# Patient Record
Sex: Female | Born: 1935 | Race: White | Hispanic: No | Smoking: Never smoker
Health system: Southern US, Community
[De-identification: ages and names within clinical notes are randomized; demographics above are authoritative.]

## PROBLEM LIST (undated history)

## (undated) DIAGNOSIS — N189 Chronic kidney disease, unspecified: Secondary | ICD-10-CM

## (undated) DIAGNOSIS — I739 Peripheral vascular disease, unspecified: Secondary | ICD-10-CM

## (undated) DIAGNOSIS — G459 Transient cerebral ischemic attack, unspecified: Secondary | ICD-10-CM

## (undated) DIAGNOSIS — E785 Hyperlipidemia, unspecified: Secondary | ICD-10-CM

## (undated) DIAGNOSIS — M40209 Unspecified kyphosis, site unspecified: Secondary | ICD-10-CM

## (undated) DIAGNOSIS — H3530 Unspecified macular degeneration: Secondary | ICD-10-CM

## (undated) DIAGNOSIS — S32050A Wedge compression fracture of fifth lumbar vertebra, initial encounter for closed fracture: Secondary | ICD-10-CM

## (undated) DIAGNOSIS — IMO0002 Reserved for concepts with insufficient information to code with codable children: Secondary | ICD-10-CM

## (undated) DIAGNOSIS — I4891 Unspecified atrial fibrillation: Secondary | ICD-10-CM

## (undated) DIAGNOSIS — R634 Abnormal weight loss: Secondary | ICD-10-CM

## (undated) DIAGNOSIS — R0600 Dyspnea, unspecified: Secondary | ICD-10-CM

## (undated) DIAGNOSIS — S301XXA Contusion of abdominal wall, initial encounter: Secondary | ICD-10-CM

## (undated) DIAGNOSIS — I10 Essential (primary) hypertension: Secondary | ICD-10-CM

## (undated) DIAGNOSIS — E875 Hyperkalemia: Secondary | ICD-10-CM

## (undated) DIAGNOSIS — Z7901 Long term (current) use of anticoagulants: Secondary | ICD-10-CM

## (undated) HISTORY — DX: Unspecified kyphosis, site unspecified: M40.209

## (undated) HISTORY — DX: Peripheral vascular disease, unspecified: I73.9

## (undated) HISTORY — DX: Dyspnea, unspecified: R06.00

## (undated) HISTORY — DX: Long term (current) use of anticoagulants: Z79.01

## (undated) HISTORY — DX: Hyperkalemia: E87.5

## (undated) HISTORY — DX: Chronic kidney disease, unspecified: N18.9

## (undated) HISTORY — DX: Abnormal weight loss: R63.4

## (undated) HISTORY — DX: Unspecified macular degeneration: H35.30

## (undated) HISTORY — DX: Unspecified atrial fibrillation: I48.91

## (undated) HISTORY — PX: APPENDECTOMY: SHX54

## (undated) HISTORY — DX: Hyperlipidemia, unspecified: E78.5

## (undated) HISTORY — DX: Essential (primary) hypertension: I10

---

## 1973-05-27 HISTORY — PX: CHOLECYSTECTOMY: SHX55

## 2002-06-18 ENCOUNTER — Encounter: Payer: Self-pay | Admitting: Family Medicine

## 2002-06-18 ENCOUNTER — Ambulatory Visit (HOSPITAL_COMMUNITY): Admission: RE | Admit: 2002-06-18 | Discharge: 2002-06-18 | Payer: Self-pay | Admitting: Family Medicine

## 2002-06-25 ENCOUNTER — Ambulatory Visit (HOSPITAL_COMMUNITY): Admission: RE | Admit: 2002-06-25 | Discharge: 2002-06-25 | Payer: Self-pay | Admitting: Family Medicine

## 2002-06-25 ENCOUNTER — Encounter: Payer: Self-pay | Admitting: Family Medicine

## 2002-07-22 ENCOUNTER — Encounter (INDEPENDENT_AMBULATORY_CARE_PROVIDER_SITE_OTHER): Payer: Self-pay | Admitting: *Deleted

## 2002-07-22 ENCOUNTER — Ambulatory Visit (HOSPITAL_COMMUNITY): Admission: RE | Admit: 2002-07-22 | Discharge: 2002-07-22 | Payer: Self-pay | Admitting: Neurosurgery

## 2002-07-22 ENCOUNTER — Encounter (INDEPENDENT_AMBULATORY_CARE_PROVIDER_SITE_OTHER): Payer: Self-pay | Admitting: Specialist

## 2002-07-22 ENCOUNTER — Encounter: Payer: Self-pay | Admitting: Neurosurgery

## 2002-09-21 ENCOUNTER — Encounter: Payer: Self-pay | Admitting: Neurosurgery

## 2002-09-21 ENCOUNTER — Encounter: Admission: RE | Admit: 2002-09-21 | Discharge: 2002-09-21 | Payer: Self-pay | Admitting: Neurosurgery

## 2002-10-26 ENCOUNTER — Encounter: Admission: RE | Admit: 2002-10-26 | Discharge: 2002-10-26 | Payer: Self-pay | Admitting: Neurosurgery

## 2002-10-26 ENCOUNTER — Encounter: Payer: Self-pay | Admitting: Neurosurgery

## 2002-12-14 ENCOUNTER — Encounter: Admission: RE | Admit: 2002-12-14 | Discharge: 2002-12-14 | Payer: Self-pay | Admitting: Neurosurgery

## 2002-12-14 ENCOUNTER — Encounter: Payer: Self-pay | Admitting: Neurosurgery

## 2003-12-06 ENCOUNTER — Ambulatory Visit (HOSPITAL_COMMUNITY): Admission: RE | Admit: 2003-12-06 | Discharge: 2003-12-06 | Payer: Self-pay | Admitting: Family Medicine

## 2005-11-17 ENCOUNTER — Inpatient Hospital Stay (HOSPITAL_COMMUNITY): Admission: EM | Admit: 2005-11-17 | Discharge: 2005-11-22 | Payer: Self-pay | Admitting: Emergency Medicine

## 2005-11-17 ENCOUNTER — Ambulatory Visit: Payer: Self-pay | Admitting: Cardiovascular Disease

## 2005-11-18 ENCOUNTER — Encounter: Payer: Self-pay | Admitting: Cardiology

## 2005-11-18 ENCOUNTER — Encounter (INDEPENDENT_AMBULATORY_CARE_PROVIDER_SITE_OTHER): Payer: Self-pay | Admitting: *Deleted

## 2005-11-25 ENCOUNTER — Ambulatory Visit: Payer: Self-pay | Admitting: *Deleted

## 2005-11-29 ENCOUNTER — Ambulatory Visit: Payer: Self-pay | Admitting: *Deleted

## 2005-12-06 ENCOUNTER — Ambulatory Visit: Payer: Self-pay | Admitting: Cardiology

## 2005-12-10 ENCOUNTER — Ambulatory Visit: Payer: Self-pay | Admitting: Cardiology

## 2006-01-17 ENCOUNTER — Ambulatory Visit: Payer: Self-pay | Admitting: Cardiology

## 2006-01-31 ENCOUNTER — Ambulatory Visit: Payer: Self-pay | Admitting: Cardiology

## 2006-02-07 ENCOUNTER — Ambulatory Visit: Payer: Self-pay | Admitting: Cardiology

## 2006-03-11 ENCOUNTER — Ambulatory Visit: Payer: Self-pay | Admitting: Cardiology

## 2006-04-16 ENCOUNTER — Ambulatory Visit: Payer: Self-pay | Admitting: Cardiology

## 2006-05-13 ENCOUNTER — Ambulatory Visit: Payer: Self-pay | Admitting: Cardiology

## 2006-06-03 ENCOUNTER — Ambulatory Visit: Payer: Self-pay | Admitting: Cardiology

## 2006-07-03 ENCOUNTER — Ambulatory Visit: Payer: Self-pay | Admitting: Internal Medicine

## 2006-08-01 ENCOUNTER — Ambulatory Visit: Payer: Self-pay | Admitting: Cardiology

## 2006-08-29 ENCOUNTER — Ambulatory Visit: Payer: Self-pay | Admitting: Cardiology

## 2006-09-26 ENCOUNTER — Ambulatory Visit (HOSPITAL_COMMUNITY): Admission: RE | Admit: 2006-09-26 | Discharge: 2006-09-26 | Payer: Self-pay | Admitting: Cardiology

## 2006-09-26 ENCOUNTER — Ambulatory Visit: Payer: Self-pay | Admitting: Cardiology

## 2006-10-02 ENCOUNTER — Ambulatory Visit: Payer: Self-pay | Admitting: Cardiology

## 2006-10-02 ENCOUNTER — Ambulatory Visit (HOSPITAL_COMMUNITY): Admission: RE | Admit: 2006-10-02 | Discharge: 2006-10-02 | Payer: Self-pay | Admitting: Cardiology

## 2006-10-03 ENCOUNTER — Ambulatory Visit: Payer: Self-pay | Admitting: Cardiology

## 2006-10-21 ENCOUNTER — Ambulatory Visit: Payer: Self-pay | Admitting: Cardiology

## 2006-11-07 ENCOUNTER — Ambulatory Visit: Payer: Self-pay | Admitting: Cardiology

## 2006-12-10 ENCOUNTER — Ambulatory Visit: Payer: Self-pay | Admitting: Cardiology

## 2006-12-17 ENCOUNTER — Ambulatory Visit: Payer: Self-pay | Admitting: Cardiovascular Disease

## 2007-01-15 ENCOUNTER — Ambulatory Visit: Payer: Self-pay | Admitting: Cardiology

## 2007-01-22 ENCOUNTER — Ambulatory Visit: Payer: Self-pay | Admitting: Cardiology

## 2007-01-30 ENCOUNTER — Ambulatory Visit (HOSPITAL_COMMUNITY): Admission: RE | Admit: 2007-01-30 | Discharge: 2007-01-30 | Payer: Self-pay | Admitting: Family Medicine

## 2007-02-20 ENCOUNTER — Ambulatory Visit: Payer: Self-pay | Admitting: Cardiology

## 2007-03-06 ENCOUNTER — Ambulatory Visit: Payer: Self-pay | Admitting: Internal Medicine

## 2007-03-13 ENCOUNTER — Ambulatory Visit: Payer: Self-pay | Admitting: Cardiology

## 2007-03-25 ENCOUNTER — Ambulatory Visit: Payer: Self-pay | Admitting: Cardiology

## 2007-04-15 ENCOUNTER — Ambulatory Visit: Payer: Self-pay | Admitting: Cardiology

## 2007-05-18 ENCOUNTER — Ambulatory Visit: Payer: Self-pay | Admitting: Internal Medicine

## 2007-06-19 ENCOUNTER — Ambulatory Visit: Payer: Self-pay | Admitting: Cardiology

## 2007-07-17 ENCOUNTER — Ambulatory Visit: Payer: Self-pay | Admitting: Cardiology

## 2007-08-12 ENCOUNTER — Encounter (INDEPENDENT_AMBULATORY_CARE_PROVIDER_SITE_OTHER): Payer: Self-pay | Admitting: *Deleted

## 2007-08-12 LAB — CONVERTED CEMR LAB
Albumin: 4.1 g/dL
Cholesterol: 179 mg/dL
Triglycerides: 134 mg/dL

## 2007-08-14 ENCOUNTER — Ambulatory Visit: Payer: Self-pay | Admitting: Cardiology

## 2007-09-15 ENCOUNTER — Ambulatory Visit: Payer: Self-pay | Admitting: Cardiology

## 2007-09-15 ENCOUNTER — Ambulatory Visit (HOSPITAL_COMMUNITY): Admission: RE | Admit: 2007-09-15 | Discharge: 2007-09-15 | Payer: Self-pay | Admitting: Cardiology

## 2007-09-24 ENCOUNTER — Ambulatory Visit (HOSPITAL_COMMUNITY): Admission: RE | Admit: 2007-09-24 | Discharge: 2007-09-24 | Payer: Self-pay | Admitting: Cardiology

## 2007-10-13 ENCOUNTER — Ambulatory Visit: Payer: Self-pay | Admitting: Cardiology

## 2007-11-09 ENCOUNTER — Ambulatory Visit: Payer: Self-pay | Admitting: Cardiology

## 2007-12-18 ENCOUNTER — Ambulatory Visit: Payer: Self-pay | Admitting: Cardiology

## 2008-01-25 ENCOUNTER — Ambulatory Visit: Payer: Self-pay | Admitting: Cardiology

## 2008-02-08 ENCOUNTER — Ambulatory Visit: Payer: Self-pay | Admitting: Cardiology

## 2008-02-29 ENCOUNTER — Ambulatory Visit: Payer: Self-pay | Admitting: Cardiology

## 2008-03-14 ENCOUNTER — Ambulatory Visit: Payer: Self-pay | Admitting: Cardiology

## 2008-03-31 ENCOUNTER — Ambulatory Visit: Payer: Self-pay | Admitting: Cardiology

## 2008-04-28 ENCOUNTER — Ambulatory Visit: Payer: Self-pay | Admitting: Cardiology

## 2008-05-27 ENCOUNTER — Encounter (INDEPENDENT_AMBULATORY_CARE_PROVIDER_SITE_OTHER): Payer: Self-pay | Admitting: *Deleted

## 2008-05-27 LAB — CONVERTED CEMR LAB
ALT: 18 units/L
Alkaline Phosphatase: 57 units/L
BUN: 24 mg/dL
CO2: 23 meq/L
Chloride: 106 meq/L
Glucose, Bld: 94 mg/dL
HDL: 57 mg/dL
Potassium: 4.5 meq/L
Sodium: 142 meq/L
TSH: 1.812 microintl units/mL
Total Protein: 7.4 g/dL
Triglycerides: 196 mg/dL

## 2008-05-30 ENCOUNTER — Ambulatory Visit: Payer: Self-pay | Admitting: Cardiology

## 2008-06-20 ENCOUNTER — Ambulatory Visit: Payer: Self-pay | Admitting: Cardiology

## 2008-07-18 ENCOUNTER — Ambulatory Visit: Payer: Self-pay | Admitting: Cardiology

## 2008-08-08 ENCOUNTER — Ambulatory Visit: Payer: Self-pay | Admitting: Cardiology

## 2008-09-08 ENCOUNTER — Ambulatory Visit: Payer: Self-pay | Admitting: Cardiology

## 2008-10-13 ENCOUNTER — Ambulatory Visit: Payer: Self-pay | Admitting: Cardiology

## 2008-10-31 ENCOUNTER — Encounter (INDEPENDENT_AMBULATORY_CARE_PROVIDER_SITE_OTHER): Payer: Self-pay | Admitting: *Deleted

## 2008-11-17 ENCOUNTER — Ambulatory Visit: Payer: Self-pay | Admitting: Cardiology

## 2008-12-15 ENCOUNTER — Ambulatory Visit: Payer: Self-pay | Admitting: Cardiology

## 2009-01-09 ENCOUNTER — Encounter: Payer: Self-pay | Admitting: *Deleted

## 2009-01-09 ENCOUNTER — Ambulatory Visit: Payer: Self-pay | Admitting: Cardiology

## 2009-02-08 ENCOUNTER — Ambulatory Visit: Payer: Self-pay

## 2009-02-08 LAB — CONVERTED CEMR LAB: POC INR: 2

## 2009-02-20 ENCOUNTER — Encounter: Payer: Self-pay | Admitting: Cardiology

## 2009-03-13 ENCOUNTER — Ambulatory Visit: Payer: Self-pay | Admitting: Cardiology

## 2009-03-13 LAB — CONVERTED CEMR LAB: POC INR: 2.1

## 2009-04-24 ENCOUNTER — Ambulatory Visit: Payer: Self-pay | Admitting: Cardiology

## 2009-06-01 DIAGNOSIS — E875 Hyperkalemia: Secondary | ICD-10-CM

## 2009-06-01 DIAGNOSIS — M810 Age-related osteoporosis without current pathological fracture: Secondary | ICD-10-CM | POA: Insufficient documentation

## 2009-06-01 DIAGNOSIS — H3530 Unspecified macular degeneration: Secondary | ICD-10-CM | POA: Insufficient documentation

## 2009-06-02 ENCOUNTER — Ambulatory Visit: Payer: Self-pay | Admitting: Cardiology

## 2009-06-02 ENCOUNTER — Encounter (INDEPENDENT_AMBULATORY_CARE_PROVIDER_SITE_OTHER): Payer: Self-pay | Admitting: *Deleted

## 2009-06-02 LAB — CONVERTED CEMR LAB: POC INR: 2.5

## 2009-06-12 ENCOUNTER — Encounter: Payer: Self-pay | Admitting: Cardiology

## 2009-06-12 ENCOUNTER — Encounter (INDEPENDENT_AMBULATORY_CARE_PROVIDER_SITE_OTHER): Payer: Self-pay | Admitting: *Deleted

## 2009-06-12 LAB — CONVERTED CEMR LAB
ALT: 21 units/L (ref 0–35)
BUN: 31 mg/dL
Basophils Absolute: 0.1 10*3/uL (ref 0.0–0.1)
CO2: 24 meq/L
CO2: 24 meq/L (ref 19–32)
Chloride: 105 meq/L
Creatinine, Ser: 1.38 mg/dL
Eosinophils Absolute: 0.2 10*3/uL (ref 0.0–0.7)
HCT: 43 %
HCT: 43 % (ref 36.0–46.0)
HDL: 58 mg/dL (ref >39)
Hemoglobin: 14 g/dL
Hemoglobin: 14 g/dL (ref 12.0–15.0)
Lymphs Abs: 2.1 10*3/uL (ref 0.7–4.0)
MCHC: 32.6 g/dL (ref 30.0–36.0)
MCV: 97.5 fL
MCV: 97.5 fL (ref 78.0–100.0)
Monocytes Absolute: 0.4 10*3/uL (ref 0.1–1.0)
Neutro Abs: 4.5 10*3/uL (ref 1.7–7.7)
Neutrophils Relative %: 62 % (ref 43–77)
Platelets: 263 10*3/uL
Potassium: 5 meq/L
RBC: 4.41 M/uL (ref 3.87–5.11)
Total CHOL/HDL Ratio: 2.8
WBC: 7.2 10*3/uL (ref 4.0–10.5)

## 2009-06-14 ENCOUNTER — Encounter (INDEPENDENT_AMBULATORY_CARE_PROVIDER_SITE_OTHER): Payer: Self-pay | Admitting: *Deleted

## 2009-06-28 ENCOUNTER — Ambulatory Visit: Payer: Self-pay | Admitting: Cardiology

## 2009-06-28 LAB — CONVERTED CEMR LAB: POC INR: 2.5

## 2009-07-26 ENCOUNTER — Ambulatory Visit: Payer: Self-pay | Admitting: Cardiology

## 2009-07-26 LAB — CONVERTED CEMR LAB: POC INR: 2.6

## 2009-08-23 ENCOUNTER — Ambulatory Visit: Payer: Self-pay | Admitting: Cardiology

## 2009-09-21 ENCOUNTER — Ambulatory Visit: Payer: Self-pay | Admitting: Cardiology

## 2009-10-25 ENCOUNTER — Ambulatory Visit: Payer: Self-pay | Admitting: Cardiology

## 2009-10-25 LAB — CONVERTED CEMR LAB: POC INR: 2.3

## 2009-10-26 ENCOUNTER — Encounter: Payer: Self-pay | Admitting: Cardiology

## 2009-11-08 ENCOUNTER — Ambulatory Visit: Payer: Self-pay | Admitting: Cardiology

## 2009-11-08 DIAGNOSIS — I951 Orthostatic hypotension: Secondary | ICD-10-CM

## 2009-11-22 ENCOUNTER — Ambulatory Visit: Payer: Self-pay | Admitting: Cardiology

## 2009-11-22 LAB — CONVERTED CEMR LAB: POC INR: 2

## 2009-11-24 ENCOUNTER — Telehealth (INDEPENDENT_AMBULATORY_CARE_PROVIDER_SITE_OTHER): Payer: Self-pay | Admitting: *Deleted

## 2009-11-29 ENCOUNTER — Encounter (INDEPENDENT_AMBULATORY_CARE_PROVIDER_SITE_OTHER): Payer: Self-pay | Admitting: *Deleted

## 2009-12-20 ENCOUNTER — Ambulatory Visit: Payer: Self-pay | Admitting: Cardiology

## 2010-01-10 ENCOUNTER — Ambulatory Visit: Payer: Self-pay | Admitting: Cardiology

## 2010-01-31 ENCOUNTER — Ambulatory Visit: Payer: Self-pay | Admitting: Cardiology

## 2010-01-31 LAB — CONVERTED CEMR LAB: POC INR: 1.9

## 2010-02-21 ENCOUNTER — Ambulatory Visit: Payer: Self-pay | Admitting: Cardiology

## 2010-02-21 LAB — CONVERTED CEMR LAB: POC INR: 2.4

## 2010-02-27 ENCOUNTER — Telehealth (INDEPENDENT_AMBULATORY_CARE_PROVIDER_SITE_OTHER): Payer: Self-pay | Admitting: *Deleted

## 2010-03-16 LAB — CONVERTED CEMR LAB
ALT: 22 units/L
AST: 41 units/L
BUN: 30 mg/dL
GFR calc non Af Amer: 34 mL/min
Glomerular Filtration Rate, Af Am: 42 mL/min/{1.73_m2}
HDL: 61 mg/dL
Hgb A1c MFr Bld: 6.2 %
Potassium: 4.7 meq/L
Sodium: 140 meq/L
Triglycerides: 77 mg/dL

## 2010-03-21 ENCOUNTER — Ambulatory Visit: Payer: Self-pay | Admitting: Cardiology

## 2010-03-21 LAB — CONVERTED CEMR LAB: POC INR: 1.8

## 2010-04-11 ENCOUNTER — Ambulatory Visit: Payer: Self-pay | Admitting: Cardiology

## 2010-04-11 LAB — CONVERTED CEMR LAB: POC INR: 1.7

## 2010-04-18 ENCOUNTER — Telehealth (INDEPENDENT_AMBULATORY_CARE_PROVIDER_SITE_OTHER): Payer: Self-pay

## 2010-04-25 ENCOUNTER — Encounter (INDEPENDENT_AMBULATORY_CARE_PROVIDER_SITE_OTHER): Payer: Self-pay | Admitting: *Deleted

## 2010-05-07 ENCOUNTER — Ambulatory Visit: Payer: Self-pay | Admitting: Cardiology

## 2010-06-07 ENCOUNTER — Ambulatory Visit: Admission: RE | Admit: 2010-06-07 | Discharge: 2010-06-07 | Payer: Self-pay | Source: Home / Self Care

## 2010-06-07 ENCOUNTER — Ambulatory Visit (HOSPITAL_COMMUNITY)
Admission: RE | Admit: 2010-06-07 | Discharge: 2010-06-07 | Payer: Self-pay | Source: Home / Self Care | Attending: Cardiology | Admitting: Cardiology

## 2010-06-07 ENCOUNTER — Ambulatory Visit
Admission: RE | Admit: 2010-06-07 | Discharge: 2010-06-07 | Payer: Self-pay | Source: Home / Self Care | Attending: Cardiology | Admitting: Cardiology

## 2010-06-07 DIAGNOSIS — I739 Peripheral vascular disease, unspecified: Secondary | ICD-10-CM | POA: Insufficient documentation

## 2010-06-07 DIAGNOSIS — R634 Abnormal weight loss: Secondary | ICD-10-CM | POA: Insufficient documentation

## 2010-06-07 LAB — CONVERTED CEMR LAB: POC INR: 1.6

## 2010-06-20 ENCOUNTER — Ambulatory Visit: Admission: RE | Admit: 2010-06-20 | Discharge: 2010-06-20 | Payer: Self-pay | Source: Home / Self Care

## 2010-06-20 LAB — CONVERTED CEMR LAB: POC INR: 2.2

## 2010-06-26 NOTE — Medication Information (Signed)
Summary: ccr-lr  Anticoagulant Therapy  Managed by: Vashti Hey, RN PCP: Dr. Lysle Rubens MD: Dietrich Pates MD, Molly Maduro Indication 1: Atrial Fibrillation Indication 2: Cerebrovascular Accident Lab Used: [facility] [place] Site: [place] INR POC 2.0  Dietary changes: no    Health status changes: no    Bleeding/hemorrhagic complications: no    Recent/future hospitalizations: no    Any changes in medication regimen? no    Recent/future dental: no  Any missed doses?: no       Is patient compliant with meds? yes       Allergies: No Known Drug Allergies  Anticoagulation Management History:      The patient is taking warfarin and comes in today for a routine follow up visit.  Positive risk factors for bleeding include an age of 75 years or older.  The bleeding index is 'intermediate risk'.  Positive CHADS2 values include History of HTN and Age > 70 years old.  The start date was 11/22/2005.  Anticoagulation responsible provider: Dietrich Pates MD, Molly Maduro.  INR POC: 2.0.  Cuvette Lot#: [id number].  Exp: 10/11.    Anticoagulation Management Assessment/Plan:      The patient's current anticoagulation dose is Warfarin sodium 2 mg tabs: as directed by coumadin clinic.  The target INR is 2.0-3.0.  The next INR is due 12/20/2009.  Anticoagulation instructions were given to patient.  Results were reviewed/authorized by Vashti Hey, RN.  She was notified by Vashti Hey RN.         Prior Anticoagulation Instructions: INR 2.3 Continue coumadin 2mg  once daily   Current Anticoagulation Instructions: INR 2.0 Take coumadin 1 1/2 tablets tonight then resume 1 tablet once daily

## 2010-06-26 NOTE — Assessment & Plan Note (Signed)
Summary: ROV  Medications Added AMLODIPINE BESYLATE 5 MG TABS (AMLODIPINE BESYLATE) Take one tablet by mouth daily LISINOPRIL 20 MG TABS (LISINOPRIL) Take one tablet by mouth daily DONEPEZIL HCL 10 MG TABS (DONEPEZIL HCL) take 1 tab daily CITALOPRAM HYDROBROMIDE 20 MG TABS (CITALOPRAM HYDROBROMIDE) take 1 tab daily ALEVE 220 MG TABS (NAPROXEN SODIUM) take as needed FOSAMAX 70 MG TABS (ALENDRONATE SODIUM) take 1 tab weekly CLARITIN 10 MG TABS (LORATADINE) take 1 tab daily LUMIGAN 0.03 % SOLN (BIMATOPROST) 1 drop each eye bedtime ASPIR-LOW 81 MG TBEC (ASPIRIN) take 1 tab daily      Allergies Added: NKDA  Visit Type:  Follow-up Primary Provider:  Dr. Phillips Odor  CC:  no cardiology complaints.  History of Present Illness: Mrs. 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 During routine coumadin clinic check, she complained of chest discomfort.  She believed this to be related to pulled muscle from working in the yard that day. She had some positional dizziness. She was seen by the cardiology nurses.  Orthostatics were completed and found to be positive with a drop of 115/57 with pulse of 93 to 93/56 with a pulse of 137 from sitting to standing.  Dr. Dietrich Pates reviewed this and decreased her dose of Norvasc from 10mg  daily, to 5mg  daily.  Lisinopril was decreased from 40mg  to 20mg  daily.  She has not further complaints of dizziness or chest discomfort.  Her daughter states that she is much better and the patient agrees.  She is contiuing to be active and has occasional muscle aches and pains.  No chest discomfort.  Current Medications (verified): 1)  Metoprolol Succinate 25 Mg Xr24h-Tab (Metoprolol Succinate) .... Take 1 Tablet Daily 2)  Amlodipine Besylate 10 Mg Tabs (Amlodipine Besylate) .... Take One Tablet By Mouth Daily 3)  Lotensin 40 Mg Tabs (Benazepril Hcl) .... Take 1 Tablet By  Mouth Once A Day 4)  Requip 1 Mg Tabs (Ropinirole Hcl) .Marland Kitchen.. 1 Tab By Mouth At Bedtime 5)  Simvastatin 40 Mg Tabs (Simvastatin) .Marland Kitchen.. 1 Tab By Mouth At Bedtime 6)  Diphenhydramine Hcl 25 Mg Caps (Diphenhydramine Hcl) .... As Needed 7)  Warfarin Sodium 2 Mg Tabs (Warfarin Sodium) .... As Directed By Coumadin Clinic 8)  Furosemide 40 Mg Tabs (Furosemide) .Marland Kitchen.. 1 Tab By Mouth Once Daily 9)  Acetaminophen 650 Mg Cr-Tabs (Acetaminophen) .... As Needed 10)  Diazepam 2 Mg Tabs (Diazepam) .Marland Kitchen.. 1 Tab By Mouth Two Times A Day 11)  Coenzyme Q10 10 Mg Caps (Coenzyme Q10) .Marland Kitchen.. 1 Tab By Mouth Once Daily 12)  Daily Multi  Tabs (Multiple Vitamins-Minerals) .Marland Kitchen.. 1 Tab By Mouth Once Daily 13)  Fleet Laxative 5 Mg Tbec (Bisacodyl) .... As Needed 14)  Coricidin Hbp Flu 15-500-2 Mg Tabs (Dm-Apap-Cpm) .... As Needed 15)  Zyrtec Allergy 10 Mg Tabs (Cetirizine Hcl) .Marland Kitchen.. 1 Daily 16)  Donepezil Hcl 10 Mg Tabs (Donepezil Hcl) .... Take 1 Tab Daily 17)  Citalopram Hydrobromide 20 Mg Tabs (Citalopram Hydrobromide) .... Take 1 Tab Daily 18)  Aleve 220 Mg Tabs (Naproxen Sodium) .... Take As Needed 19)  Fosamax 70 Mg Tabs (Alendronate Sodium) .... Take 1 Tab Weekly 20)  Claritin 10 Mg Tabs (Loratadine) .... Take 1 Tab Daily 21)  Lumigan 0.03 % Soln (Bimatoprost) .Marland Kitchen.. 1 Drop Each Eye Bedtime 22)  Aspir-Low 81 Mg Tbec (Aspirin) .... Take 1 Tab Daily  Allergies (verified): No Known Drug Allergies  Review  of Systems       All other systems have been reviewed and are negative unless stated above.   Vital Signs:  Patient profile:   77 year old female Weight:      122 pounds Pulse rate:   52 / minute Pulse (ortho):   107 / minute BP sitting:   110 / 74  (right arm) BP standing:   96 / 67  Vitals Entered By: Dreama Saa, CNA (November 08, 2009 3:39 PM)  Serial Vital Signs/Assessments:  Time      Position  BP       Pulse  Resp  Temp     By 4:09 PM   Lying LA  107/63   90                    Lynn Via LPN 1:61 PM    Sitting   84/61    53                    Lynn Via LPN 0:96 PM   Standing  96/67    107                   Lynn Via LPN  Comments: 0:45 PM No s/s By: Larita Fife Via LPN    Physical Exam  General:  Well developed, well nourished, in no acute distress. Lungs:  Clear bilaterally to auscultation and percussion. Heart:  Non-displaced PMI, chest non-tender; regular rate and rhythm, S1, S2 without murmurs, rubs or gallops. Carotid upstroke normal, no bruit. Normal abdominal aortic size, no bruits. Femorals normal pulses, no bruits. Pedals normal pulses. No edema, no varicosities. Abdomen:  Bowel sounds positive; abdomen soft and non-tender without masses, organomegaly, or hernias noted. No hepatosplenomegaly. Extremities:  No clubbing or cyanosis. Psych:  Normal affect.   Impression & Recommendations:  Problem # 1:  ATRIAL FIBRILLATION, HX OF (ICD-V12.59) Assessment Unchanged  Her updated medication list for this problem includes:    Metoprolol Succinate 25 Mg Xr24h-tab (Metoprolol succinate) .Marland Kitchen... Take 1 tablet daily    Amlodipine Besylate 5 Mg Tabs (Amlodipine besylate) .Marland Kitchen... Take one tablet by mouth daily    Lisinopril 20 Mg Tabs (Lisinopril) .Marland Kitchen... Take one tablet by mouth daily    Warfarin Sodium 2 Mg Tabs (Warfarin sodium) .Marland Kitchen... As directed by coumadin clinic    Aspir-low 81 Mg Tbec (Aspirin) .Marland Kitchen... Take 1 tab daily  Problem # 2:  ORTHOSTATIC HYPOTENSION (ICD-458.0) Assessment: Improved Will continue new medication regimin.  Orthostatics were completed on this visit.  She is still mildly orthostatic with BP change of 10 pts. from sitting to standing.  She is asymptomatic with this. She is advised to be careful with postition changes.  Rx prescribed for new lower doses.  Patient Instructions: 1)  Your physician recommends that you schedule a follow-up appointment in: 3 MONTHS Prescriptions: LISINOPRIL 20 MG TABS (LISINOPRIL) Take one tablet by mouth daily  #30 x 6   Entered by:   Teressa Lower RN   Authorized by:   Joni Reining, NP   Signed by:   Teressa Lower RN on 11/08/2009   Method used:   [facility]  [place] [address]       [address]       [place], [place]  [address]       Ph: [phone] or [phone]       Fax:  [phone]   RxID:   [id number] AMLODIPINE BESYLATE 5 MG TABS (AMLODIPINE BESYLATE) Take one tablet by mouth daily  #30 x 6   Entered by:   Teressa Lower RN   Authorized by:   Joni Reining, NP   Signed by:   Teressa Lower RN on 11/08/2009   Method used:   [facility]        [facility]  [place] [address]       [address]       [place], [place]  [address]       Ph: [phone] or [phone]       Fax: [phone]   RxID:   [id number]

## 2010-06-26 NOTE — Miscellaneous (Signed)
Summary: labs lipids,liver 08/12/2007  Clinical Lists Changes  Observations: Added new observation of ALBUMIN: 4.1 g/dL (16/02/9603 5:40) Added new observation of PROTEIN, TOT: 7.3 g/dL (98/03/9146 8:29) Added new observation of SGPT (ALT): 16 units/L (08/12/2007 7:54) Added new observation of SGOT (AST): 28 units/L (08/12/2007 7:54) Added new observation of ALK PHOS: 60 units/L (08/12/2007 7:54) Added new observation of BILI DIRECT: 0.1 mg/dL (56/21/3086 5:78) Added new observation of LDL: 97 mg/dL (46/96/2952 8:41) Added new observation of HDL: 55 mg/dL (32/44/0102 7:25) Added new observation of TRIGLYC TOT: 134 mg/dL (36/64/4034 7:42) Added new observation of CHOLESTEROL: 179 mg/dL (59/56/3875 6:43)

## 2010-06-26 NOTE — Progress Notes (Signed)
Summary: RX REFILL NEEDED TODAY   Phone Note Call from Patient Call back at Home Phone [phone]   Caller: PT Reason for Call: Refill Medication Summary of Call: PT NEEDS WARFRIN 2MG  #60 [facility] [place] [phone]. IF POSSIBLE PLEASE CALL IN TODAY THEY HAVE BEEN TRYING TO GET FILLED FOR A COUPLE DAYS AND SHE IS GOING OUT OF TOWN. Initial call taken by: Faythe Ghee,  April 18, 2010 9:47 AM  Follow-up for Phone Call        Pt's granddaughter is upset stating that her mother called this morning concerning refill for Mrs. Zarcone's Warfarin. We recieved call at 9:47 this morning and have been busy seeing Dr. Ival Bible pt's and then a meeting before lunch. She also states that the pharmacy has been calling and faxing this office for this refill for days, we have no records of this. Pt. cancelled scheduled appt. with Dr. Dietrich Pates on Sept. 16 and has not rescheduled. She states that they are leaving town at 2:00 today and have to have this refill. Granddaughter was very rude to staff on the phone and has been in the past while at the office. She wanted to argue on the phone but I ended conversation stating we would refill Warfarin and that they need to call and reschedule appt. with Dr. Dietrich Pates. Follow-up by: Larita Fife Via LPN,  April 18, 2010 12:32 PM    Prescriptions: WARFARIN SODIUM 2 MG TABS (WARFARIN SODIUM) 3mg  once daily except 2mg  on Sundays, Tuesdays and Thursdays or as directed by anticoagulation clinic  #60 x 2   Entered by:   Tammy Sanders RN   Authorized by:   Robert M Rothbart, MD, FACC   Signed by:   Tammy Sanders RN on 04/18/2010   Method used:   [facility]  [address] [id number]       [address]       [place], [place]  [address]       Ph: [phone] or [phone]       Fax: [phone]   RxID:   [id number]  Pt grandaughter called the office staff being belligerent and combative once again.   Staff was unable to speak with this grandaughter due to her insistance that we were not taking care of her grandmother because they were going out  of town at 2pm and we had not refilled her medication.  Pt's warfarin was refilled on 03/09/2010 #60 with 2 refills.  This is the same grandaughter that threathen my physcial life in the lobby at an office visit.  Teressa Lower RN  April 18, 2010 12:36 PM

## 2010-06-26 NOTE — Medication Information (Signed)
Summary: ccr-lr  Anticoagulant Therapy  Managed by: Vashti Hey, RN PCP: Dr. Lysle Rubens MD: Daleen Squibb MD, Maisie Fus Indication 1: Atrial Fibrillation Indication 2: Cerebrovascular Accident Lab Used: [facility] [place] Site: [place] INR POC 1.7  Dietary changes: no    Health status changes: no    Bleeding/hemorrhagic complications: no    Recent/future hospitalizations: no    Any changes in medication regimen? no    Recent/future dental: no  Any missed doses?: no       Is patient compliant with meds? yes       Allergies: No Known Drug Allergies  Anticoagulation Management History:      The patient is taking warfarin and comes in today for a routine follow up visit.  Positive risk factors for bleeding include an age of 75 years or older.  The bleeding index is 'intermediate risk'.  Positive CHADS2 values include History of HTN and Age > 58 years old.  The start date was 11/22/2005.  Anticoagulation responsible provider: Daleen Squibb MD, Maisie Fus.  INR POC: 1.7.  Cuvette Lot#: [id number].  Exp: 10/11.    Anticoagulation Management Assessment/Plan:      The patient's current anticoagulation dose is Warfarin sodium 2 mg tabs: as directed by coumadin clinic.  The target INR is 2.0-3.0.  The next INR is due 01/31/2010.  Anticoagulation instructions were given to patient.  Results were reviewed/authorized by Vashti Hey, RN.  She was notified by Vashti Hey RN.         Prior Anticoagulation Instructions: INR 1.8 Take coumadin 2 tablets tonight then increase dose to 1 tablet once daily except 1 1/2 tablets on Wednesdays  Current Anticoagulation Instructions: INR 1.7 Take coumadin 2 tablets tonight then increase dose to 1 tablet once daily except 1 1/2 tablets on Mondays, Wednesdays and Fridays

## 2010-06-26 NOTE — Medication Information (Signed)
Summary: CCR      Allergies Added: NKDA Anticoagulant Therapy  Managed by: Teressa Lower, RN PCP: Dr. Lysle Rubens MD: Dietrich Pates MD, Molly Maduro Indication 1: Atrial Fibrillation Indication 2: Cerebrovascular Accident Lab Used: [facility] [place] Site: [place] INR POC 2.5  Dietary changes: no    Health status changes: no    Bleeding/hemorrhagic complications: no    Recent/future hospitalizations: no    Any changes in medication regimen? no    Recent/future dental: no  Any missed doses?: no       Is patient compliant with meds? yes       Current Medications (verified): 1)  Metoprolol Succinate 25 Mg Xr24h-Tab (Metoprolol Succinate) .... Take 1 Tablet Daily 2)  Amlodipine Besylate 10 Mg Tabs (Amlodipine Besylate) .... Take One Tablet By Mouth Daily 3)  Lotensin 40 Mg Tabs (Benazepril Hcl) .... Take 1 Tablet By Mouth Once A Day 4)  Requip 1 Mg Tabs (Ropinirole Hcl) .Marland Kitchen.. 1 Tab By Mouth At Bedtime 5)  Simvastatin 40 Mg Tabs (Simvastatin) .Marland Kitchen.. 1 Tab By Mouth At Bedtime 6)  Diphenhydramine Hcl 25 Mg Caps (Diphenhydramine Hcl) .... As Needed 7)  Warfarin Sodium 2 Mg Tabs (Warfarin Sodium) .... As Directed By Coumadin Clinic 8)  Furosemide 40 Mg Tabs (Furosemide) .Marland Kitchen.. 1 Tab By Mouth Once Daily 9)  Acetaminophen 650 Mg Cr-Tabs (Acetaminophen) .... As Needed 10)  Diazepam 2 Mg Tabs (Diazepam) .Marland Kitchen.. 1 Tab By Mouth Two Times A Day 11)  Coenzyme Q10 10 Mg Caps (Coenzyme Q10) .Marland Kitchen.. 1 Tab By Mouth Once Daily 12)  Daily Multi  Tabs (Multiple Vitamins-Minerals) .Marland Kitchen.. 1 Tab By Mouth Once Daily 13)  Fleet Laxative 5 Mg Tbec (Bisacodyl) .... As Needed 14)  Coricidin Hbp Flu 15-500-2 Mg Tabs (Dm-Apap-Cpm) .... As Needed 15)  Zyrtec Allergy 10 Mg Tabs (Cetirizine Hcl) .Marland Kitchen.. 1 Daily  Allergies (verified): No Known Drug Allergies  Anticoagulation Management History:      The patient is taking warfarin and comes in today for a routine follow up visit.   Positive risk factors for bleeding include an age of 29 years or older.  The bleeding index is 'intermediate risk'.  Positive CHADS2 values include History of HTN and Age > 65 years old.  The start date was 11/22/2005.  Anticoagulation responsible provider: Dietrich Pates MD, Molly Maduro.  INR POC: 2.5.  Cuvette Lot#: [id number].  Exp: 10/11.    Anticoagulation Management Assessment/Plan:      The patient's current anticoagulation dose is Warfarin sodium 2 mg tabs: as directed by coumadin clinic.  The target INR is 2.0-3.0.  The next INR is due 06/29/2009.  Anticoagulation instructions were given to patient.  Results were reviewed/authorized by Teressa Lower, RN.  She was notified by Teressa Lower RN.         Prior Anticoagulation Instructions: INR 2.7 Continue coumadin 5mg  once daily   Current Anticoagulation Instructions: INR 2.5 TODAY NO CHANGE IN CURRENT COUMADIN DOSE CONTINUE 1 TABLET BY MOUTH DAILY

## 2010-06-26 NOTE — Letter (Signed)
Summary: Appointment - Missed  [facility] [place]  [address], [place] [address]   Phone: [phone]  Fax: [phone]     April 25, 2010 MRN: [id number]   [facility] [address] [place], [place]  [address]   Dear [name],  Our records indicate you missed your appointment on       04/25/10 COUMADIN CLINIC                       It is very important that we reach you to reschedule this appointment. We look forward to participating in your health care needs. Please contact us at the number listed above at your earliest convenience to reschedule this appointment.     Sincerely,    Glass blower/designer

## 2010-06-26 NOTE — Assessment & Plan Note (Signed)
Summary: 1 yr fu/sn  Medications Added REQUIP 1 MG TABS (ROPINIROLE HCL) 1 tab by mouth at bedtime SIMVASTATIN 40 MG TABS (SIMVASTATIN) 1 tab by mouth at bedtime DIPHENHYDRAMINE HCL 25 MG CAPS (DIPHENHYDRAMINE HCL) as needed WARFARIN SODIUM 2 MG TABS (WARFARIN SODIUM) as directed by coumadin clinic FUROSEMIDE 40 MG TABS (FUROSEMIDE) 1 tab by mouth once daily ACETAMINOPHEN 650 MG CR-TABS (ACETAMINOPHEN) as needed DIAZEPAM 2 MG TABS (DIAZEPAM) 1 tab by mouth two times a day COENZYME Q10 10 MG CAPS (COENZYME Q10) 1 tab by mouth once daily DAILY MULTI  TABS (MULTIPLE VITAMINS-MINERALS) 1 tab by mouth once daily FLEET LAXATIVE 5 MG TBEC (BISACODYL) as needed CORICIDIN HBP FLU 15-500-2 MG TABS (DM-APAP-CPM) as needed ZYRTEC ALLERGY 10 MG TABS (CETIRIZINE HCL) 1 daily      Allergies Added: NKDA  Primary Provider:  Dr. Phillips Odor   History of Present Illness: Ms. [name] returns to the office as scheduled for continued assessment and treatment of atrial fibrillation with a history of thromboembolic CVA and multiple cardiovascular risk factors.  Since I last saw her one year ago, she has done beautifully.  She has developed no new medical problems, required no urgent medical care and required little routine medical care.  She remains active, cooking and cleaning, and without difficulty.  She has suffered no falls.  She notes no apparent adverse effects from her medications.  She describes mild intermittent pedal edema, most prominent on the left.  Current Medications (verified): 1)  Metoprolol Succinate 25 Mg Xr24h-Tab (Metoprolol Succinate) .... Take 1 Tablet Daily 2)  Amlodipine Besylate 10 Mg Tabs (Amlodipine Besylate) .... Take One Tablet By Mouth Daily 3)  Lotensin 40 Mg Tabs (Benazepril Hcl) .... Take 1 Tablet By Mouth Once A Day 4)  Requip 1 Mg Tabs (Ropinirole Hcl) .Marland Kitchen.. 1 Tab By Mouth At Bedtime 5)  Simvastatin 40 Mg Tabs (Simvastatin) .Marland Kitchen.. 1 Tab By Mouth At Bedtime 6)   Diphenhydramine Hcl 25 Mg Caps (Diphenhydramine Hcl) .... As Needed 7)  Warfarin Sodium 2 Mg Tabs (Warfarin Sodium) .... As Directed By Coumadin Clinic 8)  Furosemide 40 Mg Tabs (Furosemide) .Marland Kitchen.. 1 Tab By Mouth Once Daily 9)  Acetaminophen 650 Mg Cr-Tabs (Acetaminophen) .... As Needed 10)  Diazepam 2 Mg Tabs (Diazepam) .Marland Kitchen.. 1 Tab By Mouth Two Times A Day 11)  Coenzyme Q10 10 Mg Caps (Coenzyme Q10) .Marland Kitchen.. 1 Tab By Mouth Once Daily 12)  Daily Multi  Tabs (Multiple Vitamins-Minerals) .Marland Kitchen.. 1 Tab By Mouth Once Daily 13)  Fleet Laxative 5 Mg Tbec (Bisacodyl) .... As Needed 14)  Coricidin Hbp Flu 15-500-2 Mg Tabs (Dm-Apap-Cpm) .... As Needed 15)  Zyrtec Allergy 10 Mg Tabs (Cetirizine Hcl) .Marland Kitchen.. 1 Daily  Allergies (verified): No Known Drug Allergies  Past History:  PMH, FH, and Social History reviewed and updated.  Social History: Retired  Single  Alcohol Use - no Regular Exercise - no Drug Use - no Does not drive and never has.  Review of Systems       The patient complains of vision loss and peripheral edema.  The patient denies anorexia, fever, weight loss, weight gain, decreased hearing, hoarseness, chest pain, syncope, dyspnea on exertion, prolonged cough, headaches, hemoptysis, and abdominal pain.    Vital Signs:  Patient profile:   77 year old female Height:      55 inches Weight:      132 pounds BMI:     30.79 Pulse rate:   72 / minute BP sitting:  138 / 76  (left arm)  Physical Exam  General:   General-Trim;well developed; no acute distress:   Neck-No JVD; no carotid bruits: Lungs-No tachypnea, no rales; no rhonchi; no wheezes: Cardiovascular-normal PMI; normal S1 and S2; modest systolic ejection murmur at the cardiac base Abdomen-BS normal; soft and non-tender without masses or organomegaly:  Musculoskeletal-No deformities, no cyanosis or clubbing; full range of motion of the left knee without evidence for effusion; no mass palpated in the popliteal space; no tenderness  to palpation. Neurologic-Normal cranial nerves; symmetric strength and tone:  Skin-Warm, no significant lesions: Extremities-Nl distal pulses;1+ left ankle edema:     Impression & Recommendations:  Problem # 1:  COUMADIN THERAPY (ICD-V58.61) She has tolerated Coumadin well without any significant complications.  A CBC and stool for Hemoccult testing will be obtained.  We will continue to adjust dosage in our Coumadin clinic.  Problem # 2:  RENAL INSUFFICIENCY (ICD-588.9) She has had borderline renal insufficiency in the past with mild hyperkalemia.  A chemistry profile will be obtained.  Problem # 3:  PVD (ICD-443.9) She describes chronic left leg discomfort.  She notes mild to moderate discomfort in the popliteal fossa, worse with sitting or at rest than when she is walking or standing.  She experienced no problems with that knee in the past.  This certainly does not sound like claudication.  It does not clearly represent arthritis.  She may have a popliteal cyst.  Symptoms are mild enough such that I am not inclined to proceed with further diagnostic testing.  Problem # 4:  ATRIAL FIBRILLATION, HX OF (ICD-V12.59) Control of heart rate in atrial fibrillation he is excellent.  She has no known structural heart disease.  Current medications will be continued.  There is no need for additional cardiac testing at present.  I will plan to reassess this nice woman in one year.  Other Orders: T-CBC w/Diff ([id number]) T-Comprehensive Metabolic Panel [phone]) T-Lipid Profile ([id number]) Hemoccult Cards (Take Home) (Hemoccult Cards)  Patient Instructions: 1)  Your physician recommends that you schedule a follow-up appointment in: 1 year 2)  Your physician recommends that you return for lab work next week 3)  Your physician recommends that you continue on your current medications as directed. Please refer to the Current Medication list given to you today. 4)  Your physician has asked  that you test your stool for blood. It is necessary to test 3 different stool specimens for accuracy. You will be given 3 hemoccult cards for specimen collection. For each stool specimen, place a small portion of stool sample (from 2 different areas of the stool) into the 2 squares on the card. Close card. Repeat with 2 more stool specimens. Bring the cards back to the office for testing.

## 2010-06-26 NOTE — Medication Information (Signed)
Summary: ccr-lr  Anticoagulant Therapy  Managed by: Vashti Hey, RN PCP: Dr. Lysle Rubens MD: Dietrich Pates MD, Molly Maduro Indication 1: Atrial Fibrillation Indication 2: Cerebrovascular Accident Lab Used: [facility] [place] Site: [place] INR POC 2.3  Dietary changes: no    Health status changes: no    Bleeding/hemorrhagic complications: no    Recent/future hospitalizations: no    Any changes in medication regimen? no    Recent/future dental: no  Any missed doses?: no       Is patient compliant with meds? yes       Allergies: No Known Drug Allergies  Anticoagulation Management History:      The patient is taking warfarin and comes in today for a routine follow up visit.  Positive risk factors for bleeding include an age of 75 years or older.  The bleeding index is 'intermediate risk'.  Positive CHADS2 values include History of HTN and Age > 64 years old.  The start date was 11/22/2005.  Anticoagulation responsible provider: Dietrich Pates MD, Molly Maduro.  INR POC: 2.3.  Cuvette Lot#: [id number].  Exp: 10/11.    Anticoagulation Management Assessment/Plan:      The patient's current anticoagulation dose is Warfarin sodium 2 mg tabs: as directed by coumadin clinic.  The target INR is 2.0-3.0.  The next INR is due 11/23/2009.  Anticoagulation instructions were given to patient.  Results were reviewed/authorized by Vashti Hey, RN.  She was notified by Vashti Hey RN.         Prior Anticoagulation Instructions: INR 2.8 Continue coumadin 2mg  once daily   Current Anticoagulation Instructions: INR 2.3 Continue coumadin 2mg  once daily

## 2010-06-26 NOTE — Medication Information (Signed)
Summary: CCR  Anticoagulant Therapy  Managed by: Vashti Hey, RN PCP: Dr. Lysle Rubens MD: Dietrich Pates MD, Molly Maduro Indication 1: Atrial Fibrillation Indication 2: Cerebrovascular Accident Lab Used: [facility] [place] Site: [place] INR POC 2.5  Dietary changes: no    Health status changes: no    Bleeding/hemorrhagic complications: no    Recent/future hospitalizations: no    Any changes in medication regimen? no    Recent/future dental: no  Any missed doses?: no       Is patient compliant with meds? yes       Allergies: No Known Drug Allergies  Anticoagulation Management History:      The patient is taking warfarin and comes in today for a routine follow up visit.  Positive risk factors for bleeding include an age of 33 years or older.  The bleeding index is 'intermediate risk'.  Positive CHADS2 values include History of HTN and Age > 80 years old.  The start date was 11/22/2005.  Anticoagulation responsible provider: Dietrich Pates MD, Molly Maduro.  INR POC: 2.5.  Cuvette Lot#: [id number].  Exp: 10/11.    Anticoagulation Management Assessment/Plan:      The patient's current anticoagulation dose is Warfarin sodium 2 mg tabs: as directed by coumadin clinic.  The target INR is 2.0-3.0.  The next INR is due 07/26/2009.  Anticoagulation instructions were given to patient.  Results were reviewed/authorized by Vashti Hey, RN.  She was notified by Vashti Hey RN.         Prior Anticoagulation Instructions: INR 2.5 TODAY NO CHANGE IN CURRENT COUMADIN DOSE CONTINUE 1 TABLET BY MOUTH DAILY   Current Anticoagulation Instructions: INR 2.5 Continue coumadin 2mg  once daily

## 2010-06-26 NOTE — Progress Notes (Signed)
   Phone Note Call from Patient   Caller: [name]'s daughter, POA  Details for Reason: to talk to manager about confusion of drugs. Summary of Call: Beverly Leash, [name]'s daughter called to talk with me about confusion of the meds [name] is taking.  She also wanted to talk about the interaction between our staff and [name]'s daughter during a vist on June 29th.  Beverly Leash has indicated that [name] received a rx for Lisnopri.  [name] has not been taking Lisnopri.  Beverly Leash indicated that she was not infomed about this addition to her mom's med list.  She thinks that the nurse may have confused the med Lotensin for Lisnopri when filling the refill.    The med Lotensin 40mg  was added by [name] on 02/20/2009.  I do not find any refernce to the med Lisinopril untill June 15th of 2011.  [name]'s concern is what to do. Should [name] continue taking the Lisinopril or not?  [name]'s blood presure readings are as follows: June 1 in the clinic:  122/57 June 29 in the clinic:  115/58 June 30 at home:   115/60  Please call Beverly Leash to tell her what to do about blood presure meds. Initial call taken by: [name]  Follow-up for Phone Call        Talked with [name] about Beverly's meds.  [name] instructed that [name] should continue taking Amlodpine 5mg  and the Lisinopril 20mg . Discontinue the former blood presure med. Follow-up by: [name]    Additional Follow-up for Phone Call Additional follow up Details #2::    Talked with Beverly Leash and gave instructions on meds. keep using Lisinopril 20mg  and Amlodpine 5mg . Stop taking the old blood presure med.  Keep doctor follow up appt. Follow-up by: [name]

## 2010-06-26 NOTE — Letter (Signed)
Summary: [place] [facility], agricultural at [facility]  [address], [place] [address]   Phone: [phone]  Fax: [phone]      June 14, 2009 MRN: [id number]   [facility] [address] [place], [place]  [address]   Dear Ms. Narain,  Your test ordered by Selena Batten has been reviewed by your physician (or physician assistant) and was found to be normal or stable. Your physician (or physician assistant) felt no changes were needed at this time.  ____ Echocardiogram  ____ Cardiac Stress Test  _x___ Lab Work  ____ Peripheral vascular study of arms, legs or neck  ____ CT scan or X-ray  ____ Lung or Breathing test  ____ Other:  No change in medical treatment at this time, per Dr. Dietrich Pates.   Thank you, Carnie Bruemmer Allyne Gee RN    [place] Bing, MD, Lenise Arena.C.Gaylord Shih, MD, F.A.C.C Lewayne Bunting, MD, F.A.C.C Nona Dell, MD, F.A.C.C Charlton Haws, MD, Lenise Arena.C.C

## 2010-06-26 NOTE — Assessment & Plan Note (Signed)
Summary: nurse visit for BP check and EKG/per daughter request/tg  Nurse Visit   Vital Signs:  Patient profile:   77 year old female O2 Sat:      96 % Pulse rate:   78 / minute Pulse (ortho):   137 / minute BP sitting:   122 / 57  (left arm) BP standing:   93 / 56  Vitals Entered ByLarita Fife Via LPN (October 25, 452 11:48 AM)  Serial Vital Signs/Assessments:  Time      Position  BP       Pulse  Resp  Temp     By 11:49 AM  Lying LA  115/57   93                    Lynn Via LPN 09:81 AM  Sitting   108/80   78                    Lynn Via LPN 19:14 AM  Standing  93/56    137                   Lynn Via LPN  Comments: 78:29 AM Pt. c/o slight dizziness when sitting from lying position and when standing from sitting position. By: Larita Fife Via LPN    Visit Type:  Nurse Visit Primary Provider:  Dr. Phillips Odor   History of Present Illness: S:  Pt. came in for Coumadin check this morning.  Daughter, [name], asked if a nurse could see the pt. due to CP.  B:  new onset CP. A:  Pt. is having CP that started last Monday or Tuesday after gardening.  She states that the pain radiates to the center of her back and only hurts in the mornings and at night when she lays down, pt. states the pain maybe a pulled muscle from gardening. She also c/o SOB (O2=96% on RA) and dizziness (orthostatic BP's in chart),  Pt. denies nausea, sweating, pain in arms, shoulders or jaw. EKG performed. R:  Will call pt. with Dr. Marvel Plan recommendations, if any.   10/26/2009  Patient needs appointment to see me or Natalia Leatherwood.   Decrease amlodipine 5 mg q.d. Decrease lisinopril to 20 mg q.d.  Sibley Bing, M.D.  [facility].               Larita Fife Via LPN  October 27, 5619 9:30 AM     [name], pt's daughter advised and she repeated med changes back to me, appt. scheduled for 11-08-09 with Dr. Dietrich Pates.    Larita Fife Via LPN  October 30, 3084 8:51 AM     Allergies: No Known Drug Allergies

## 2010-06-26 NOTE — Medication Information (Signed)
Summary: ccr-lr  Anticoagulant Therapy  Managed by: Vashti Hey, RN PCP: Dr. Lysle Rubens MD: Dietrich Pates MD, Molly Maduro Indication 1: Atrial Fibrillation Indication 2: Cerebrovascular Accident Lab Used: [facility] [place] Site: [place] INR POC 2.6  Dietary changes: no    Health status changes: no    Bleeding/hemorrhagic complications: no    Recent/future hospitalizations: no    Any changes in medication regimen? no    Recent/future dental: no  Any missed doses?: no       Is patient compliant with meds? yes       Allergies: No Known Drug Allergies  Anticoagulation Management History:      The patient is taking warfarin and comes in today for a routine follow up visit.  Positive risk factors for bleeding include an age of 69 years or older.  The bleeding index is 'intermediate risk'.  Positive CHADS2 values include History of HTN and Age > 20 years old.  The start date was 11/22/2005.  Anticoagulation responsible provider: Dietrich Pates MD, Molly Maduro.  INR POC: 2.6.  Cuvette Lot#: [id number].  Exp: 10/11.    Anticoagulation Management Assessment/Plan:      The patient's current anticoagulation dose is Warfarin sodium 2 mg tabs: as directed by coumadin clinic.  The target INR is 2.0-3.0.  The next INR is due 08/24/2009.  Anticoagulation instructions were given to patient.  Results were reviewed/authorized by Vashti Hey, RN.  She was notified by Vashti Hey RN.         Prior Anticoagulation Instructions: INR 2.5 Continue coumadin 2mg  once daily   Current Anticoagulation Instructions: INR 2.6 Continue coumadin 2mg  once daily

## 2010-06-26 NOTE — Medication Information (Signed)
Summary: ccr-lr  Anticoagulant Therapy  Managed by: Vashti Hey, RN PCP: Dr. Lysle Rubens MD: Dietrich Pates MD, Molly Maduro Indication 1: Atrial Fibrillation Indication 2: Cerebrovascular Accident Lab Used: [facility] [place] Site: [place] INR POC 1.7  Dietary changes: no    Health status changes: no    Bleeding/hemorrhagic complications: no    Recent/future hospitalizations: no    Any changes in medication regimen? no    Recent/future dental: no  Any missed doses?: no       Is patient compliant with meds? yes       Allergies: No Known Drug Allergies  Anticoagulation Management History:      The patient is taking warfarin and comes in today for a routine follow up visit.  Positive risk factors for bleeding include an age of 75 years or older.  The bleeding index is 'intermediate risk'.  Positive CHADS2 values include History of HTN and Age > 40 years old.  The start date was 11/22/2005.  Anticoagulation responsible Devlin Brink: Dietrich Pates MD, Molly Maduro.  INR POC: 1.7.  Cuvette Lot#: [id number].  Exp: 10/11.    Anticoagulation Management Assessment/Plan:      The patient's current anticoagulation dose is Warfarin sodium 2 mg tabs: 3mg  once daily except 2mg  on Sundays, Tuesdays and Thursdays or as directed by anticoagulation clinic.  The target INR is 2.0-3.0.  The next INR is due 04/25/2010.  Anticoagulation instructions were given to patient.  Results were reviewed/authorized by Vashti Hey, RN.  She was notified by Vashti Hey RN.         Prior Anticoagulation Instructions: INR 1.8 Take coumadin 3mg  once daily except 2mg  on Tuesdays  Current Anticoagulation Instructions: INR 1.7 Increase coumadin to 3mg  once daily except 4mg  on Wednesdays

## 2010-06-26 NOTE — Medication Information (Signed)
Summary: ccr-lr  Anticoagulant Therapy  Managed by: Vashti Hey, RN PCP: Dr. Lysle Rubens MD: Dietrich Pates MD, Molly Maduro Indication 1: Atrial Fibrillation Indication 2: Cerebrovascular Accident Lab Used: [facility] [place] Site: [place] INR POC 1.8  Dietary changes: no    Health status changes: no    Bleeding/hemorrhagic complications: no    Recent/future hospitalizations: no    Any changes in medication regimen? no    Recent/future dental: no  Any missed doses?: no       Is patient compliant with meds? yes       Allergies: No Known Drug Allergies  Anticoagulation Management History:      The patient is taking warfarin and comes in today for a routine follow up visit.  Positive risk factors for bleeding include an age of 39 years or older.  The bleeding index is 'intermediate risk'.  Positive CHADS2 values include History of HTN and Age > 56 years old.  The start date was 11/22/2005.  Anticoagulation responsible provider: Dietrich Pates MD, Molly Maduro.  INR POC: 1.8.  Cuvette Lot#: [id number].  Exp: 10/11.    Anticoagulation Management Assessment/Plan:      The patient's current anticoagulation dose is Warfarin sodium 2 mg tabs: as directed by coumadin clinic.  The target INR is 2.0-3.0.  The next INR is due 01/10/2010.  Anticoagulation instructions were given to patient.  Results were reviewed/authorized by Vashti Hey, RN.  She was notified by Vashti Hey RN.         Prior Anticoagulation Instructions: INR 2.0 Take coumadin 1 1/2 tablets tonight then resume 1 tablet once daily   Current Anticoagulation Instructions: INR 1.8 Take coumadin 2 tablets tonight then increase dose to 1 tablet once daily except 1 1/2 tablets on Wednesdays

## 2010-06-26 NOTE — Medication Information (Signed)
Summary: ccr-lr  Anticoagulant Therapy  Managed by: Vashti Hey, RN PCP: Dr. Lysle Rubens MD: Dietrich Pates MD, Molly Maduro Indication 1: Atrial Fibrillation Indication 2: Cerebrovascular Accident Lab Used: [facility] [place] Site: [place] INR POC 2.8  Dietary changes: no    Health status changes: no    Bleeding/hemorrhagic complications: no    Recent/future hospitalizations: no    Any changes in medication regimen? no    Recent/future dental: no  Any missed doses?: no       Is patient compliant with meds? yes       Allergies: No Known Drug Allergies  Anticoagulation Management History:      The patient is taking warfarin and comes in today for a routine follow up visit.  Positive risk factors for bleeding include an age of 75 years or older.  The bleeding index is 'intermediate risk'.  Positive CHADS2 values include History of HTN and Age > 20 years old.  The start date was 11/22/2005.  Anticoagulation responsible provider: Dietrich Pates MD, Molly Maduro.  INR POC: 2.8.  Cuvette Lot#: [id number].  Exp: 10/11.    Anticoagulation Management Assessment/Plan:      The patient's current anticoagulation dose is Warfarin sodium 2 mg tabs: as directed by coumadin clinic.  The target INR is 2.0-3.0.  The next INR is due 10/19/2009.  Anticoagulation instructions were given to patient.  Results were reviewed/authorized by Vashti Hey, RN.  She was notified by Vashti Hey RN.         Prior Anticoagulation Instructions: INR 2.3 Continue coumadin 2mg  once daily   Current Anticoagulation Instructions: INR 2.8 Continue coumadin 2mg  once daily

## 2010-06-26 NOTE — Medication Information (Signed)
Summary: ccr-lr  Anticoagulant Therapy  Managed by: Vashti Hey, RN PCP: Dr. Lysle Rubens MD: Dietrich Pates MD, Molly Maduro Indication 1: Atrial Fibrillation Indication 2: Cerebrovascular Accident Lab Used: [facility] [place] Site: [place] INR POC 2.4  Dietary changes: no    Health status changes: no    Bleeding/hemorrhagic complications: no    Recent/future hospitalizations: no    Any changes in medication regimen? no    Recent/future dental: no  Any missed doses?: no       Is patient compliant with meds? yes       Allergies: No Known Drug Allergies  Anticoagulation Management History:      The patient is taking warfarin and comes in today for a routine follow up visit.  Positive risk factors for bleeding include an age of 75 years or older.  The bleeding index is 'intermediate risk'.  Positive CHADS2 values include History of HTN and Age > 2 years old.  The start date was 11/22/2005.  Anticoagulation responsible provider: Dietrich Pates MD, Molly Maduro.  INR POC: 2.4.  Cuvette Lot#: [id number].  Exp: 10/11.    Anticoagulation Management Assessment/Plan:      The patient's current anticoagulation dose is Warfarin sodium 2 mg tabs: as directed by coumadin clinic.  The target INR is 2.0-3.0.  The next INR is due 03/21/2010.  Anticoagulation instructions were given to patient.  Results were reviewed/authorized by Vashti Hey, RN.  She was notified by Vashti Hey RN.         Prior Anticoagulation Instructions: INR 1.9 Take coumadin 2 tablets tonight then increase dose to 1 1/2  tablets once daily except 1 tablet on Sundays, Tuesdays and Thursdays  Current Anticoagulation Instructions: INR 2.4 Continue coumadin 3mg  once daily except 2mg  on Sundays, Tuesdays and Thursdays

## 2010-06-26 NOTE — Medication Information (Signed)
Summary: ccr-lr  Anticoagulant Therapy  Managed by: Vashti Hey, RN PCP: Dr. Lysle Rubens MD: Dietrich Pates MD, Molly Maduro Indication 1: Atrial Fibrillation Indication 2: Cerebrovascular Accident Lab Used: [facility] [place] Site: [place] INR POC 2.3  Dietary changes: no    Health status changes: no    Bleeding/hemorrhagic complications: no    Recent/future hospitalizations: no    Any changes in medication regimen? no    Recent/future dental: no  Any missed doses?: no       Is patient compliant with meds? yes       Allergies: No Known Drug Allergies  Anticoagulation Management History:      The patient is taking warfarin and comes in today for a routine follow up visit.  Positive risk factors for bleeding include an age of 70 years or older.  The bleeding index is 'intermediate risk'.  Positive CHADS2 values include History of HTN and Age > 34 years old.  The start date was 11/22/2005.  Anticoagulation responsible provider: Dietrich Pates MD, Molly Maduro.  INR POC: 2.3.  Cuvette Lot#: [id number].  Exp: 10/11.    Anticoagulation Management Assessment/Plan:      The patient's current anticoagulation dose is Warfarin sodium 2 mg tabs: as directed by coumadin clinic.  The target INR is 2.0-3.0.  The next INR is due 09/21/2009.  Anticoagulation instructions were given to patient.  Results were reviewed/authorized by Vashti Hey, RN.  She was notified by Vashti Hey RN.         Prior Anticoagulation Instructions: INR 2.6 Continue coumadin 2mg  once daily   Current Anticoagulation Instructions: INR 2.3 Continue coumadin 2mg  once daily

## 2010-06-26 NOTE — Miscellaneous (Signed)
Summary: LABS CBCD,CMP,LIPIDS,06/12/2009  Clinical Lists Changes  Observations: Added new observation of CREATININE: 1.38 mg/dL (56/43/3295 18:84) Added new observation of BUN: 31 mg/dL (16/60/6301 60:10) Added new observation of BG RANDOM: 94 mg/dL (93/23/5573 22:02) Added new observation of CO2 PLSM/SER: 24 meq/L (06/12/2009 15:56) Added new observation of CL SERUM: 105 meq/L (06/12/2009 15:56) Added new observation of K SERUM: 5.0 meq/L (06/12/2009 15:56) Added new observation of NA: 141 meq/L (06/12/2009 15:56) Added new observation of PLATELETK/UL: 263 K/uL (06/12/2009 15:56) Added new observation of MCV: 97.5 fL (06/12/2009 15:56) Added new observation of HCT: 43.0 % (06/12/2009 15:56) Added new observation of HGB: 14.0 g/dL (54/27/0623 76:28) Added new observation of WBC COUNT: 7.2 10*3/microliter (06/12/2009 15:56)

## 2010-06-26 NOTE — Medication Information (Signed)
Summary: ccr-lr  Anticoagulant Therapy  Managed by: Vashti Hey, RN PCP: Dr. Lysle Rubens MD: Dietrich Pates MD, Molly Maduro Indication 1: Atrial Fibrillation Indication 2: Cerebrovascular Accident Lab Used: [facility] [place] Site: [place] INR POC 1.9  Dietary changes: no    Health status changes: no    Bleeding/hemorrhagic complications: no    Recent/future hospitalizations: no    Any changes in medication regimen? no    Recent/future dental: no  Any missed doses?: no       Is patient compliant with meds? yes       Allergies: No Known Drug Allergies  Anticoagulation Management History:      The patient is taking warfarin and comes in today for a routine follow up visit.  Positive risk factors for bleeding include an age of 75 years or older.  The bleeding index is 'intermediate risk'.  Positive CHADS2 values include History of HTN and Age > 55 years old.  The start date was 11/22/2005.  Anticoagulation responsible provider: Dietrich Pates MD, Molly Maduro.  INR POC: 1.9.  Cuvette Lot#: [id number].  Exp: 10/11.    Anticoagulation Management Assessment/Plan:      The patient's current anticoagulation dose is Warfarin sodium 2 mg tabs: as directed by coumadin clinic.  The target INR is 2.0-3.0.  The next INR is due 02/21/2010.  Anticoagulation instructions were given to patient.  Results were reviewed/authorized by Vashti Hey, RN.  She was notified by Vashti Hey RN.         Prior Anticoagulation Instructions: INR 1.7 Take coumadin 2 tablets tonight then increase dose to 1 tablet once daily except 1 1/2 tablets on Mondays, Wednesdays and Fridays  Current Anticoagulation Instructions: INR 1.9 Take coumadin 2 tablets tonight then increase dose to 1 1/2  tablets once daily except 1 tablet on Sundays, Tuesdays and Thursdays

## 2010-06-26 NOTE — Progress Notes (Signed)
Summary: Refill w/ higher quantity of pills  Medications Added WARFARIN SODIUM 2 MG TABS (WARFARIN SODIUM) 3mg  once daily except 2mg  on "Sundays, Tuesdays and Thursdays or as directed by anticoagulation clinic       Phone Note Call from Patient   Caller: Daughter Reason for Call: Talk to Nurse Summary of Call: pt's daughter states that since Coumadin dose has been raised they are running out of pills / wants to know if there can be a higher quantity of pills [facility] [place] / tg Initial call taken by: Terry Goins PCC,  February 27, 2010 11:57 AM    New/Updated Medications: WARFARIN SODIUM 2 MG TABS (WARFARIN SODIUM) 3mg once daily except 2mg on Sundays, Tuesdays and Thursdays or as directed by anticoagulation clinic Prescriptions: WARFARIN SODIUM 2 MG TABS (WARFARIN SODIUM) 3mg once daily except 2mg on Sundays, Tuesdays and Thursdays or as directed by anticoagulation clinic  #60 x 2   Entered by:   Tammy Sanders RN   Authorized by:   Kathryn Lawrence, NP   Signed by:   Tammy Sanders RN on 02/27/2010   Method used:   [facility] to        [facility]  [address] [facility] [address] [id number]       [address]       [place], [place]  [address]       Ph: [phone] or [phone]       Fax: [phone]   RxID:   [id number]

## 2010-06-26 NOTE — Medication Information (Signed)
Summary: ccr-lr  Anticoagulant Therapy  Managed by: Vashti Hey, RN PCP: Dr. Lysle Rubens MD: Diona Browner MD, Remi Deter Indication 1: Atrial Fibrillation Indication 2: Cerebrovascular Accident Lab Used: [facility] [place] Site: [place] INR POC 1.8  Dietary changes: no    Health status changes: no    Bleeding/hemorrhagic complications: no    Recent/future hospitalizations: no    Any changes in medication regimen? no    Recent/future dental: no  Any missed doses?: no       Is patient compliant with meds? yes       Allergies: No Known Drug Allergies  Anticoagulation Management History:      The patient is taking warfarin and comes in today for a routine follow up visit.  Positive risk factors for bleeding include an age of 95 years or older.  The bleeding index is 'intermediate risk'.  Positive CHADS2 values include History of HTN and Age > 37 years old.  The start date was 11/22/2005.  Anticoagulation responsible provider: Diona Browner MD, Remi Deter.  INR POC: 1.8.  Cuvette Lot#: [id number].  Exp: 10/11.    Anticoagulation Management Assessment/Plan:      The patient's current anticoagulation dose is Warfarin sodium 2 mg tabs: 3mg  once daily except 2mg  on Sundays, Tuesdays and Thursdays or as directed by anticoagulation clinic.  The target INR is 2.0-3.0.  The next INR is due 04/11/2010.  Anticoagulation instructions were given to patient.  Results were reviewed/authorized by Vashti Hey, RN.  She was notified by Vashti Hey RN.         Prior Anticoagulation Instructions: INR 2.4 Continue coumadin 3mg  once daily except 2mg  on Sundays, Tuesdays and Thursdays  Current Anticoagulation Instructions: INR 1.8 Take coumadin 3mg  once daily except 2mg  on Tuesdays

## 2010-06-26 NOTE — Miscellaneous (Signed)
Summary: LABS CMP,LIPIDS,TSH 06/13/2008  Clinical Lists Changes  Observations: Added new observation of CALCIUM: 10.0 mg/dL (60/45/4098 1:19) Added new observation of ALBUMIN: 4.2 g/dL (14/78/2956 2:13) Added new observation of PROTEIN, TOT: 7.4 g/dL (08/65/7846 9:62) Added new observation of SGPT (ALT): 18 units/L (05/27/2008 8:05) Added new observation of SGOT (AST): 31 units/L (05/27/2008 8:05) Added new observation of ALK PHOS: 57 units/L (05/27/2008 8:05) Added new observation of CREATININE: 1.31 mg/dL (95/28/4132 4:40) Added new observation of BUN: 24 mg/dL (03/23/2535 6:44) Added new observation of BG RANDOM: 94 mg/dL (03/47/4259 5:63) Added new observation of CO2 PLSM/SER: 23 meq/L (05/27/2008 8:05) Added new observation of CL SERUM: 106 meq/L (05/27/2008 8:05) Added new observation of K SERUM: 4.5 meq/L (05/27/2008 8:05) Added new observation of NA: 142 meq/L (05/27/2008 8:05) Added new observation of LDL: 104 mg/dL (87/56/4332 9:51) Added new observation of HDL: 57 mg/dL (88/41/6606 3:01) Added new observation of TRIGLYC TOT: 196 mg/dL (60/02/9322 5:57) Added new observation of CHOLESTEROL: 200 mg/dL (32/20/2542 7:06) Added new observation of TSH: 1.812 microintl units/mL (05/27/2008 8:05)

## 2010-06-28 NOTE — Assessment & Plan Note (Signed)
Summary: F1Y  Medications Added COLACE 100 MG CAPS (DOCUSATE SODIUM) take 1 cap three times a day      Allergies Added: NKDA  Visit Type:  Follow-up Primary Provider:  Dr. Phillips Odor   History of Present Illness: [name] returns to the office as scheduled for continued assessment and treatment of atrial fibrillation with a history of CVA.  She has had problems over recent months with variable blood pressures, difficulty maintaining a therapeutic INR and marked weight loss.  She denies GI symptoms other than mild chronic constipation.  Appetite and food intake was down for a while, but she believes this has resolved.  She reports basic blood work by Dr. Phillips Odor with no abnormalities reported.  Current Medications (verified): 1)  Metoprolol Succinate 25 Mg Xr24h-Tab (Metoprolol Succinate) .... Take 1 Tablet Daily 2)  Requip 1 Mg Tabs (Ropinirole Hcl) .Marland Kitchen.. 1 Tab By Mouth At Bedtime 3)  Diphenhydramine Hcl 25 Mg Caps (Diphenhydramine Hcl) .... As Needed 4)  Warfarin Sodium 2 Mg Tabs (Warfarin Sodium) .... 3mg  Once Daily Except 2mg  On Sundays, Tuesdays and Thursdays or As Directed By Anticoagulation Clinic 5)  Furosemide 40 Mg Tabs (Furosemide) .... 1 Tab By Mouth Once Daily 6)  Acetaminophen 650 Mg Cr-Tabs (Acetaminophen) .... As Needed 7)  Diazepam 2 Mg Tabs (Diazepam) .... 1 Tab By Mouth Two Times A Day 8)  Coenzyme Q10 10 Mg Caps (Coenzyme Q10) .... 1 Tab By Mouth Once Daily 9)  Daily Multi  Tabs (Multiple Vitamins-Minerals) .... 1 Tab By Mouth Once Daily 10)  Fleet Laxative 5 Mg Tbec (Bisacodyl) .... As Needed 11)  Coricidin Hbp Flu 15-500-2 Mg Tabs (Dm-Apap-Cpm) .... As Needed 12)  Zyrtec Allergy 10 Mg Tabs (Cetirizine Hcl) .... 1 Daily 13)  Donepezil Hcl 10 Mg Tabs (Donepezil Hcl) .... Take 1 Tab Daily 14)  Citalopram Hydrobromide 20 Mg Tabs (Citalopram Hydrobromide) .... Take 1 Tab Daily 15)  Aleve 220 Mg Tabs (Naproxen Sodium) .... Take As Needed 16)  Fosamax 70 Mg Tabs  (Alendronate Sodium) .... Take 1 Tab Weekly 17)  Claritin 10 Mg Tabs (Loratadine) .... Take 1 Tab Daily 18)  Lumigan 0.03 % Soln (Bimatoprost) .... 1 Drop Each Eye Bedtime 19)  Colace 100 Mg Caps (Docusate Sodium) .... Take 1 Cap Three Times A Day  Allergies (verified): No Known Drug Allergies  Comments:  Nurse/Medical Assistant: [facility] [place] brought meds   Past History:  PMH, FH, and Social History reviewed and updated.  Past Medical History: Chronic atrial fibrillation with a history of thromboembolic CVA (right cerebral) in 10/2005;  Chronic anticoagulation: nl CBC in 05/2008 HYPERTENSION (ICD-401.9) HYPERLIPIDEMIA (ICD-272.4) PVD (ICD-443.9)-no claudication; distal pulses are decreased KYPHOSIS (ICD-737.10) RENAL INSUFFICIENCY-exacerbated by diuresis; creatinine of 1.8 in 6/98, 1.7 in 11/2006 and 1.36 in 3/09 & 1/10 Weight loss HYPERKALEMIA (ICD-276.7): 5.7 in 3/09; 4.5 and 1/10 DYSPNEA (ICD-786.05) MACULAR DEGENERATION (ICD-362.50) OSTEOPOROSIS (ICD-733.00) History of vertebral fracture on  Review of Systems       The patient complains of anorexia and weight loss.  The patient denies fever, vision loss, decreased hearing, hoarseness, chest pain, syncope, dyspnea on exertion, peripheral edema, prolonged cough, headaches, abdominal pain, melena, and severe indigestion/heartburn.    Vital Signs:  Patient profile:   77 year old female Weight:      109 pounds BMI:     25.43 O2 Sat:      97  % on Room air Pulse rate:   80 / minute BP sitting:  141 / 72  (left arm)  Vitals Entered By: Beverly Saa, CNA (June 07, 2010 3:07 PM)  O2 Flow:  Room air  Physical Exam  General:  Proportionate height and weight; well developed; no acute distress: Weight: 109 pounds,ounds decreased since 10/2009 and 44 pounds decreased since 3/09   Neck-No JVD; no carotid bruits: Lungs-No tachypnea, no rales; no rhonchi; no wheezes; moderate kyphosis Cardiovascular-irregular  somewhat rapid rhythm; normal S1 and S2: Abdomen-BS normal; soft and non-tender without masses or organomegaly:  Musculoskeletal-No deformities, no cyanosis or clubbing: Neurologic-Normal cranial nerves; symmetric strength and tone:  Skin-Warm, no significant lesions: Extremities-Nl distal pulses; no edema:     Impression & Recommendations:  Problem # 1:  ATRIAL FIBRILLATION, HX OF (ICD-V12.59) Heart rate is fairly well controlled with an apical rate of 92.  Her modest dose of metoprolol appears to be adequate.  Anticoagulation has been effective in preventing a recurrent embolic CVA.  CBC was normal 6 months ago.  Dr. Phillips Odor recently performed multiple blood tests.  The results of these will be sought.  Stool Hemoccult testing will be performed.  She is doing well with a strategy of rate control and anticoagulation, which will be continued.  Problem # 2:  WEIGHT LOSS, RECENT [id number]) Patient has had a very significant weight loss over the past year or so associated with some anorexia but no localizing symptoms.  A chest x-ray will be obtained to rule out intrathoracic tumor, but extensive imaging studies generally are not helpful in this situation.  [name] will attempt to maintain her caloric intake and will followup with Dr. Phillips Odor for this problem.  Problem # 3:  HYPERTENSION (ICD-401.9) Hypertension is adequately controlled with current medication, which will be continued.  BP today: 141/72 Prior BP: 96/67 (11/08/2009)  Labs Reviewed: K+: 5.0 (06/12/2009) Creat: : 1.38 (06/12/2009)     Problem # 4:  HYPERLIPIDEMIA (ICD-272.4) A lipid profile was fairly good last year with current therapy, which does not include a statin.   Reassessment of lipid status is warranted.  Chol: 163 (06/12/2009)   HDL: 58 (06/12/2009)   LDL: 89 (06/12/2009)   TG: 78 (06/12/2009)  Other Orders: T-Chest x-ray, 2 views (71020) Hemoccult Cards (Take Home) (Hemoccult Cards)  Patient  Instructions: 1)  Your physician recommends that you schedule a follow-up appointment in: 1 year 2)  Your physician has recommended you make the following change in your medication: stop aspirin 3)  A chest x-ray takes a picture of the organs and structures inside the chest, including the heart, lungs, and blood vessels. This test can show several things, including, whether the heart is enlarged; whether fluid is building up in the lungs; and whether pacemaker / defibrillator leads are still in place. 4)  Your physician has asked that you test your stool for blood. It is necessary to test 3 different stool specimens for accuracy. You will be given 3 hemoccult cards for specimen collection. For each stool specimen, place a small portion of stool sample (from 2 different areas of the stool) into the 2 squares on the card. Close card. Repeat with 2 more stool specimens. Bring the cards back to the office for testing.

## 2010-06-28 NOTE — Medication Information (Signed)
Summary: ccr-lr  Anticoagulant Therapy  Managed by: Vashti Hey, RN PCP: Dr. Lysle Rubens MD: Diona Browner MD, Remi Deter Indication 1: Atrial Fibrillation Indication 2: Cerebrovascular Accident Lab Used: [facility] [place] Site: [place] INR POC 2.2  Dietary changes: no    Health status changes: no    Bleeding/hemorrhagic complications: no    Recent/future hospitalizations: no    Any changes in medication regimen? no    Recent/future dental: no  Any missed doses?: no       Is patient compliant with meds? yes       Allergies: No Known Drug Allergies  Anticoagulation Management History:      The patient is taking warfarin and comes in today for a routine follow up visit.  Positive risk factors for bleeding include an age of 21 years or older.  The bleeding index is 'intermediate risk'.  Positive CHADS2 values include History of HTN and Age > 28 years old.  The start date was 11/22/2005.  Anticoagulation responsible provider: Diona Browner MD, Remi Deter.  INR POC: 2.2.  Cuvette Lot#: [id number].  Exp: 10/11.    Anticoagulation Management Assessment/Plan:      The patient's current anticoagulation dose is Warfarin sodium 2 mg tabs: 3mg  once daily except 2mg  on Sundays, Tuesdays and Thursdays or as directed by anticoagulation clinic.  The target INR is 2.0-3.0.  The next INR is due 07/11/2010.  Anticoagulation instructions were given to patient.  Results were reviewed/authorized by Vashti Hey, RN.  She was notified by Vashti Hey RN.         Prior Anticoagulation Instructions: INR 1.6 Increase coumadin to 4mg  once daily except 3mg  on Wednesdays   Current Anticoagulation Instructions: INR 2.2 Continue coumadin 4mg  once daily except 3mg  on Wednesdays

## 2010-06-28 NOTE — Medication Information (Signed)
Summary: protime/tg  Anticoagulant Therapy  Managed by: Vashti Hey, RN PCP: Dr. Lysle Rubens MD: Daleen Squibb MD, Maisie Fus Indication 1: Atrial Fibrillation Indication 2: Cerebrovascular Accident Lab Used: [facility] [place] Site: [place] INR POC 1.9  Dietary changes: no    Health status changes: no    Bleeding/hemorrhagic complications: no    Recent/future hospitalizations: no    Any changes in medication regimen? no    Recent/future dental: no  Any missed doses?: no       Is patient compliant with meds? yes       Allergies: No Known Drug Allergies  Anticoagulation Management History:      The patient is taking warfarin and comes in today for a routine follow up visit.  Positive risk factors for bleeding include an age of 75 years or older.  The bleeding index is 'intermediate risk'.  Positive CHADS2 values include History of HTN and Age > 68 years old.  The start date was 11/22/2005.  Anticoagulation responsible Eleonore Shippee: Daleen Squibb MD, Maisie Fus.  INR POC: 1.9.  Cuvette Lot#: [id number].  Exp: 10/11.    Anticoagulation Management Assessment/Plan:      The patient's current anticoagulation dose is Warfarin sodium 2 mg tabs: 3mg  once daily except 2mg  on Sundays, Tuesdays and Thursdays or as directed by anticoagulation clinic.  The target INR is 2.0-3.0.  The next INR is due 06/04/2010.  Anticoagulation instructions were given to patient.  Results were reviewed/authorized by Vashti Hey, RN.  She was notified by Vashti Hey RN.         Prior Anticoagulation Instructions: INR 1.7 Increase coumadin to 3mg  once daily except 4mg  on Wednesdays   Current Anticoagulation Instructions: INR 1.9 Increase coumadin to 3mg  once daily except 4mg  on Mondays and Thursdays

## 2010-06-28 NOTE — Medication Information (Signed)
Summary: ccr-lr  Anticoagulant Therapy  Managed by: Vashti Hey, RN PCP: Dr. Lysle Rubens MD: Dietrich Pates MD, Molly Maduro Indication 1: Atrial Fibrillation Indication 2: Cerebrovascular Accident Lab Used: [facility] [place] Site: [place] INR POC 1.6  Dietary changes: no    Health status changes: no    Bleeding/hemorrhagic complications: no    Recent/future hospitalizations: no    Any changes in medication regimen? no    Recent/future dental: no  Any missed doses?: no       Is patient compliant with meds? yes       Allergies: No Known Drug Allergies  Anticoagulation Management History:      The patient is taking warfarin and comes in today for a routine follow up visit.  Positive risk factors for bleeding include an age of 75 years or older.  The bleeding index is 'intermediate risk'.  Positive CHADS2 values include History of HTN and Age > 63 years old.  The start date was 11/22/2005.  Anticoagulation responsible provider: Dietrich Pates MD, Molly Maduro.  INR POC: 1.6.  Cuvette Lot#: [id number].  Exp: 10/11.    Anticoagulation Management Assessment/Plan:      The patient's current anticoagulation dose is Warfarin sodium 2 mg tabs: 3mg  once daily except 2mg  on Sundays, Tuesdays and Thursdays or as directed by anticoagulation clinic.  The target INR is 2.0-3.0.  The next INR is due 06/20/2010.  Anticoagulation instructions were given to patient.  Results were reviewed/authorized by Vashti Hey, RN.  She was notified by Vashti Hey RN.         Prior Anticoagulation Instructions: INR 1.9 Increase coumadin to 3mg  once daily except 4mg  on Mondays and Thursdays  Current Anticoagulation Instructions: INR 1.6 Increase coumadin to 4mg  once daily except 3mg  on Wednesdays

## 2010-07-11 ENCOUNTER — Encounter (INDEPENDENT_AMBULATORY_CARE_PROVIDER_SITE_OTHER): Payer: Medicare Other

## 2010-07-11 ENCOUNTER — Encounter: Payer: Self-pay | Admitting: Cardiology

## 2010-07-11 DIAGNOSIS — I4891 Unspecified atrial fibrillation: Secondary | ICD-10-CM

## 2010-07-11 DIAGNOSIS — I6789 Other cerebrovascular disease: Secondary | ICD-10-CM

## 2010-07-11 DIAGNOSIS — Z7901 Long term (current) use of anticoagulants: Secondary | ICD-10-CM

## 2010-07-11 LAB — CONVERTED CEMR LAB: POC INR: 2.5

## 2010-07-18 NOTE — Medication Information (Signed)
Summary: ccr-lr  Anticoagulant Therapy  Managed by: Vashti Hey, RN PCP: Dr. Lysle Rubens MD: Diona Browner MD, Remi Deter Indication 1: Atrial Fibrillation Indication 2: Cerebrovascular Accident Lab Used: [facility] [place] Site: [place] INR POC 2.5  Dietary changes: no    Health status changes: no    Bleeding/hemorrhagic complications: no    Recent/future hospitalizations: no    Any changes in medication regimen? no    Recent/future dental: no  Any missed doses?: no       Is patient compliant with meds? yes       Allergies: No Known Drug Allergies  Anticoagulation Management History:      The patient is taking warfarin and comes in today for a routine follow up visit.  Positive risk factors for bleeding include an age of 75 years or older.  The bleeding index is 'intermediate risk'.  Positive CHADS2 values include History of HTN and Age > 71 years old.  The start date was 11/22/2005.  Anticoagulation responsible provider: Diona Browner MD, Remi Deter.  INR POC: 2.5.  Cuvette Lot#: [id number].  Exp: 10/11.    Anticoagulation Management Assessment/Plan:      The patient's current anticoagulation dose is Warfarin sodium 2 mg tabs: 3mg  once daily except 2mg  on Sundays, Tuesdays and Thursdays or as directed by anticoagulation clinic.  The target INR is 2.0-3.0.  The next INR is due 08/08/2010.  Anticoagulation instructions were given to patient.  Results were reviewed/authorized by Vashti Hey, RN.  She was notified by Vashti Hey RN.         Prior Anticoagulation Instructions: INR 2.2 Continue coumadin 4mg  once daily except 3mg  on Wednesdays  Current Anticoagulation Instructions: INR 2.5 Continue coumadin 4mg  once daily except 3mg  on Wednesdays

## 2010-08-08 ENCOUNTER — Encounter (INDEPENDENT_AMBULATORY_CARE_PROVIDER_SITE_OTHER): Payer: Medicare Other

## 2010-08-08 ENCOUNTER — Encounter: Payer: Self-pay | Admitting: Cardiology

## 2010-08-08 DIAGNOSIS — I4891 Unspecified atrial fibrillation: Secondary | ICD-10-CM

## 2010-08-08 DIAGNOSIS — I6789 Other cerebrovascular disease: Secondary | ICD-10-CM

## 2010-08-14 NOTE — Medication Information (Signed)
Summary: ccr-lr  Anticoagulant Therapy  Managed by: Vashti Hey, RN PCP: Dr. Lysle Rubens MD: Daleen Squibb MD, Maisie Fus Indication 1: Atrial Fibrillation Indication 2: Cerebrovascular Accident Lab Used: [facility] [place] Site: [place] INR POC 3.1  Dietary changes: no    Health status changes: no    Bleeding/hemorrhagic complications: no    Recent/future hospitalizations: no    Any changes in medication regimen? no    Recent/future dental: no  Any missed doses?: no       Is patient compliant with meds? yes       Allergies: No Known Drug Allergies  Anticoagulation Management History:      The patient is taking warfarin and comes in today for a routine follow up visit.  Positive risk factors for bleeding include an age of 75 years or older.  The bleeding index is 'intermediate risk'.  Positive CHADS2 values include History of HTN and Age > 1 years old.  The start date was 11/22/2005.  Anticoagulation responsible provider: Daleen Squibb MD, Maisie Fus.  INR POC: 3.1.  Cuvette Lot#: [id number].  Exp: 10/11.    Anticoagulation Management Assessment/Plan:      The patient's current anticoagulation dose is Warfarin sodium 2 mg tabs: 3mg  once daily except 2mg  on Sundays, Tuesdays and Thursdays or as directed by anticoagulation clinic.  The target INR is 2.0-3.0.  The next INR is due 09/05/2010.  Anticoagulation instructions were given to patient.  Results were reviewed/authorized by Vashti Hey, RN.  She was notified by Vashti Hey RN.         Prior Anticoagulation Instructions: INR 2.5 Continue coumadin 4mg  once daily except 3mg  on Wednesdays  Current Anticoagulation Instructions: INR 3.1 Take coumadin 1 tablet tonight then resume 2 tablets once daily except 1 1/2 tablets on Wednesdays

## 2010-09-04 ENCOUNTER — Encounter: Payer: Self-pay | Admitting: Cardiology

## 2010-09-04 DIAGNOSIS — Z7901 Long term (current) use of anticoagulants: Secondary | ICD-10-CM

## 2010-09-04 DIAGNOSIS — I4891 Unspecified atrial fibrillation: Secondary | ICD-10-CM

## 2010-09-05 ENCOUNTER — Ambulatory Visit (INDEPENDENT_AMBULATORY_CARE_PROVIDER_SITE_OTHER): Payer: Medicare Other | Admitting: *Deleted

## 2010-09-05 DIAGNOSIS — I4891 Unspecified atrial fibrillation: Secondary | ICD-10-CM

## 2010-09-05 DIAGNOSIS — Z7901 Long term (current) use of anticoagulants: Secondary | ICD-10-CM

## 2010-10-03 ENCOUNTER — Ambulatory Visit (INDEPENDENT_AMBULATORY_CARE_PROVIDER_SITE_OTHER): Payer: Medicare Other | Admitting: *Deleted

## 2010-10-03 DIAGNOSIS — Z7901 Long term (current) use of anticoagulants: Secondary | ICD-10-CM

## 2010-10-03 DIAGNOSIS — I4891 Unspecified atrial fibrillation: Secondary | ICD-10-CM

## 2010-10-03 LAB — POCT INR: INR: 2.5

## 2010-10-09 NOTE — Letter (Signed)
Sep 26, 2006    Dr. Assunta Found  [address]  [address]  [place], [place]  [address]   RE:  [name], [name]  MRN:  [id number]  /  DOB:  1925/11/15   Dear [name]:   [name] was seen in anticoagulation clinic this morning, and was  referred to me due to problems with fluid retention.  As you know, she  has developed substantial pedal edema along with exertional dyspnea.  She may have some resting symptoms as well, and possibly some orthopnea  or PND, this, despite a normal echocardiogram 1 year ago.  She initially  responded well to a reasonable dose of furosemide, but subsequently  developed progressive azotemia prompting discontinuation of her  diuretic.  This resulted in recurrent edema, and recurrent respiratory  symptoms.  She also has had cramps or aches, it is difficult to tell, in  her legs prompting treatment with ibuprofen 600 mg t.i.d.  Finally, she  was chronically treated with Lotrel 10/10 mg daily, but this has been  changed to benazepril 20 mg daily, presumably due to the potential  contribution of amlodipine to her edema.   In summary, current medications include:  1. Simvastatin 40 mg daily.  2. Warfarin as directed.  3. Requip1 mg nightly.  4. Lumigan eye drops.  5. Claritin 1 daily.  6. Ibuprofen 600 mg t.i.d.  7. Coenzyme Q nightly.  8. Benazepril 20 mg daily.  9. Furosemide 40 mg daily, on hold.  10.She also uses Valium 2.5 mg p.r.n., which provides some relief from      her leg discomfort.   EXAMINATION:  Pleasant, older woman, in no respiratory distress at rest.  Her weight is 156, eight pounds more than in July 2007.  Blood pressure  150/90.  Heart rate 88 and irregular.  Respirations 18.  NECK:  Mild jugular venous distention.  Normal carotid upstrokes without  bruits.  LUNGS:  Clear.  CARDIAC:  Irregular rhythm.  Normal 1s and 2nd heart sounds.  No 3rd  heart sound appreciated.  ABDOMEN:  Not particularly distended.   Soft and non-tender.  No  organomegaly.  EXTREMITIES:  Two to 3+ pitting pretibial and ankle edema extending to  the knees.   EKG:  Atrial fibrillation with controlled ventricular response.  LVH  with repolarization abnormalities that are somewhat more prominent than  on a prior tracing performed December 10, 2005.   Chest x-ray:  Normal vascularity.  Cardiomegaly.  Suggestion of a nodule  in the right upper lobe.  CT scan recommended.   Other laboratory notable for normal CBC, normal electrolytes, a BUN of  24, and a creatinine of 1.5, improved from values of 34 and 2.1 three  weeks ago, and slightly abnormal LFTs with AST of 69, ALT of 37, and  decreased albumin of 3.3.  BNP is 693.   IMPRESSION:  [name] presents a difficult management problem of  fluid retention, but prerenal azotemia with diuresis.  It will be  necessary to try to find a middle course where her fluid status is good,  but her renal function is acceptable.  We may have to accept some  elevation in BUN and creatinine.  Ibuprofen may be contributing to renal  dysfunction, and will be discontinued.  Benazepril is also a possible  offending agent, but longterm treatment with ACE inhibitor is desirable,  so we will maintain that medication for the time being.  It appears that  she will need  additional antihypertensive medication, but his will be  addressed when her acute issues have resolved.  We will proceed with a  CT scan of the chest per the radiologist's suggestion.  An  echocardiogram will also be performed to verify that left ventricular  systolic function remains normal.  She will resume furosemide 40 mg  daily, and be followed closely until stable.  I will see her again in 1  week, at which time a metabolic profile will be repeated.  We will  continue to adjust warfarin dosage.  She will follow her weights at  home.    Sincerely,      Gerrit Friends. Dietrich Pates, MD, [facility]  Electronically Signed    RMR/MedQ   DD: 09/26/2006  DT: 09/26/2006  Job #: [id number]

## 2010-10-09 NOTE — Procedures (Signed)
[name], [name]           ACCOUNT NO.:  [id number]   MEDICAL RECORD NO.:  [id number]          PATIENT TYPE:  OUT   LOCATION:  RAD                           FACILITY:  [facility]   PHYSICIAN:  Gerrit Friends. Dietrich Pates, MD, FACCDATE OF BIRTH:  [birth year]   DATE OF PROCEDURE:  10/02/2006  DATE OF DISCHARGE:                                ECHOCARDIOGRAM   REFERRING PHYSICIANS:  1. Dr. Phillips Odor.  2. Gerrit Friends. Dietrich Pates, MD, [facility].   CLINICAL DATA:  A 77 year old woman with dyspnea and hypertension.  M-  mode aorta 3.1, left atrium 4.4, septum 2, posterior wall 1.5, LV  diastole 3.6, LV systole 2.9.  1. Technically adequate echocardiographic study.  2. Moderate left and right atrial enlargement; normal right      ventricular size and function; mild to moderate RVH.  3. Normal proximal ascending aorta; diameter of the aortic arch is at      the upper limit of normal; there is moderate calcification of the      proximal aortic wall and aortic annulus.  4. Mild to moderate sclerosis of a trileaflet aortic valve; mild      insufficiency.  5. Normal mitral valve; moderate to marked annular calcification; mild      regurgitation.  6. Normal tricuspid valve; physiologic regurgitation; mild elevation      in estimated RV systolic pressure.  7. Normal pulmonic valve and proximal pulmonary artery.  8. Normal left ventricular size; moderate hypertrophy; asymmetric      septal hypertrophy without systolic anterior motion of the mitral      valve nor a LVOT gradient.  Normal regional and global LV systolic      function.  9. Normal IVC.  10.Comparison with prior study of December 06, 2003:  Interval increase in      left and right atrial size; increased LVH.      Gerrit Friends. Dietrich Pates, MD, [facility]  Electronically Signed     RMR/MEDQ  D:  10/03/2006  T:  10/03/2006  Job:  [id number]

## 2010-10-09 NOTE — Letter (Signed)
Oct 21, 2006    Judithann Sheen, MD  [address]  [place], [place]  [address]   RE:  [name], [name]  MRN:  [id number]  /  DOB:  [birth year]   Dear [name],   [name] returns to the office for continued assessment and  treatment of hypertension, atrial fibrillation, and dyspnea. Since her  last visit, she has improved substantially. She notes no shortness of  breath. Blood pressure control has been good. She is feeling just fine.   CURRENT MEDICATIONS:  Include;  1. Simvastatin 40 mg daily.  2. Warfarin as directed.  3. Requip 1 mg at bedtime.  4. A number of eye drops.  5. Claritin 1 daily.  6. A number of vitamins and nutraceuticals.  7. Benazepril 20 mg daily.  8. Furosemide 40 mg daily.  9. Toprol 25 mg daily.  10.Clonidine TTS level 2 q. week.  11.Fosamax 70 mg q. week.   On exam, pleasant older woman, in no acute distress. The weight is 145,  two pounds less than 3 weeks ago. Blood pressure 115/85, heart rate 90  and irregular, respirations 16.  NECK: No jugular venous distension; no carotid bruits.  THORAX: Moderate kyphosis; clear lung fields.  CARDIAC: Normal first and second heart sounds; modest systolic murmur.  ABDOMEN: Soft and nontender; no organomegaly.  EXTREMITIES: Trace edema.   Rhythm strip: Atrial fibrillation with a controlled ventricular  response; heart rate is 80.   IMPRESSION:  [name] is doing quite well at the present time. Blood  pressure control is excellent. Her heart rate in atrial fibrillation is  appropriate. She has no dyspnea. We will reassess renal function in the  face of continued diuresis and plan a return office visit in 3 months.  It is nice to see this woman doing so well at present.    Sincerely,      Gerrit Friends. Dietrich Pates, MD, [facility]  Electronically Signed    RMR/MedQ  DD: 10/21/2006  DT: 10/21/2006  Job #: [id number]

## 2010-10-09 NOTE — Letter (Signed)
August 14, 2007    [name], M.D.  [address] [name], [place] [address]   RE:  [name], [name]  MRN:  [id number]  /  DOB:  1926/04/02   Dear [name]:   [name] returns to the office as scheduled for continued assessment  and treatment of atrial fibrillation, hyperlipidemia, and hypertension.  Since her last visit, she has done quite well.  She has had no new  medical issues, not been seen in the emergency department, and not been  admitted to the hospital.  She leads a sedentary lifestyle, but  experiences no dyspnea, no chest discomfort, and no claudication.   CURRENT MEDICATIONS:  Include:  1. Simvastatin 40 mg daily.  2. Warfarin as directed with stable and therapeutic anticoagulation.  3. Requip 1 mg every night.  4. Benazepril 40 mg daily.  5. Furosemide 40 mg daily.  6. Toprol 25 mg daily.  7. Fosamax 70 mg every week.  8. Amlodipine 10 mg daily.  9. Tylenol b.i.d. for arthritic discomfort.  10.She also takes aspirin 81 mg.  11.Valium 2.5 mg p.r.n.   On exam, older woman seated in a wheelchair in no acute distress.  The  weight is 153, 4 pounds more than in August of last year.  Heart rate 60  and irregular, blood pressure 115/65.  NECK:  No jugular venous distention; no carotid bruits.  LUNGS:  Clear.  CARDIAC:  Normal first and second heart sounds.  Modest systolic murmur.  ABDOMEN:  Soft and nontender; no masses; normal bowel sounds without  bruits.  EXTREMITIES:  1+ edema on the left; 1/2+ on the right; markedly  decreased pulses; only a faint monophasic signal can be obtained from  the dorsalis pedis pulses bilaterally in the left posterior tibial; the  right posterior tibial could not be found at all.   IMPRESSION:  [name] is doing well with good control of heart rate  in atrial fibrillation and no symptoms.  Anticoagulation proceeds  without incident.  Hemoccult testing was negative this year as well.  Her CBC was normal  yesterday.  LFTs have been normal.  Lipids are  adequately controlled with total cholesterol of 179, triglycerides 134,  HDL of 55 and LDL of 97.  The patient's daughter expressed interest in  renal function, which has been mildly impaired in the past.  A chemistry  profile will be obtained.  If results are good, I will plan to see this  nice woman again in 8 months.  She will be followed in the  anticoagulation clinic in the interim.    Sincerely,      Gerrit Friends. Dietrich Pates, MD, [facility]  Electronically Signed    RMR/MedQ  DD: 08/14/2007  DT: 08/14/2007  Job #: [id number]

## 2010-10-09 NOTE — Letter (Signed)
January 22, 2007    Wyvonnia Dusky, M.D.  [address] [name]  [place] [address]   RE:  JAMECA, CHUMLEY  MRN:  [id number]  /  DOB:  10/26/1925    Dear [name],   Ms. Wisor returns to the office for continued assessment and  treatment of atrial fibrillation with a history of CVA, as well as  hypertension, and hyperlipidemia. Since her last visit, she has done  fine. She has been asymptomatic. Heart rate has been good. Blood  pressure has been marginal with systolics around 140-150 and diastolics  around 90. Vaccinations are up to date.   CURRENT MEDICATIONS:  Are unchanged from her last visit except for the  substitution of amlodipine 5 mg daily for clonidine TTS.   On exam, pleasant older woman in no acute distress. The weight is 149,  four pounds more than in May. Blood pressure 145/90, heart rate is 75  and irregular, respirations 18.  NECK: No jugular venous distension; normal carotid upstrokes without  bruits.  THORAX: Moderate kyphosis; clear lung fields.  CARDIAC: Irregular rhythm; normal first and second heart sounds; minimal  systolic murmur.  ABDOMEN: Firm; no organomegaly.  EXTREMITIES: 1 half + ankle edema.   Recent laboratory reveals normal electrolytes with a BUN of 29 and a  creatinine of 1.7.   IMPRESSION:  Ms. Capek is doing very well with current therapy. For  optimal control of blood pressure, her dose of benazepril will be  increased to 40 mg daily and amlodipine to 10 mg daily. She will monitor  blood pressures at home and return in 1 month for assessment by the  cardiology nurse. Due to the use of chronic anticoagulants, a CBC and  stool for Hemoccult testing will be obtained. If results are good, I  will plan a return office visit in 6 months.    Sincerely,      Gerrit Friends. Dietrich Pates, MD, [facility]  Electronically Signed    RMR/MedQ  DD: 01/22/2007  DT: 01/23/2007  Job #: [id number]

## 2010-10-09 NOTE — Letter (Signed)
Oct 03, 2006    Corrie Mckusick, M.D.  [address] [name], [place] [address]   RE:  [name], [name]  MRN:  [id number]  /  DOB:  [birth year]   Dear [name]:   [name] returns to the office for close followup of congestive  heart failure and renal insufficiency. Since her last visit, she has  done fairly well. She occasionally notes dyspnea, particularly at night.  It is difficult to determine whether this is orthopnea or PND. She sits  up and improves. Her exercise tolerance is of course limited, but she  has not had much in the way of exertional symptoms. There has been no  light-headedness nor syncope. She has stayed away from ibuprofen without  difficulty. Blood pressure control has been suboptimal.   CURRENT MEDICATIONS:  1. Simvastatin 40 mg daily.  2. Warfarin as directed.  3. Requip 1 mg q.8 h.  4. Claritin daily.  5. Benazepril 20 mg daily.  6. Furosemide 40 mg daily.   PHYSICAL EXAMINATION:  GENERAL:  Pleasant, older woman in no acute  distress.  VITAL SIGNS:  The weight is 147, 3 pounds less than 2 days ago. The  blood pressure 170/95, heart rate 100 taken apically and irregular,  respirations 14.  NECK:  Ectatic right carotid; no jugular venous distention.  LUNGS:  Clear.  CARDIAC:  Normal first and second heart sounds; modest systolic ejection  murmur; grade 1/6 holosystolic decrescendo murmur at the upper left  sternal border.  ABDOMEN:  Soft and nontender; no organomegaly.  EXTREMITIES:  1+ pretibial edema.   Chemistry profile is improved with creatinine decreased to 1.3.  Electrolytes are normal. An echocardiogram shows no substantial change  from the prior tracing. There has been some progression in LVH. LV  systolic function remains normal. There is more prominent biatrial  enlargement. There is no important valvular disease but some aortic  valve sclerosis and mild aortic insufficiency.   IMPRESSION:  [name] is doing fairly well at  the present time.  Blood pressure control is suboptimal. We will add Clonidine in the  transdermal form at a level 2. Since heart rate is on the high side, a  minimal dose of (metoprolol 25 mg) will also be added. It is unclear  whether the patient has any significant congestive heart failure. BNP  was elevated, the chest x-ray was negative. CT scan also did not show  any parenchymal edema although there was a minimal left pleural  effusion. The nodule seen on chest x-ray was not apparent by CT scan.  Perhaps with slowing of her heart rate and better control of blood  pressure, her dyspnea will resolve. I will reassess this nice woman in 2  weeks.    Sincerely,      Gerrit Friends. Dietrich Pates, MD, [facility]  Electronically Signed    RMR/MedQ  DD: 10/03/2006  DT: 10/04/2006  Job #: [id number]

## 2010-10-12 NOTE — H&P (Signed)
[name], [name]           ACCOUNT NO.:  [id number]   MEDICAL RECORD NO.:  [id number]          PATIENT TYPE:  INP   LOCATION:  1823                         FACILITY:  [facility]   PHYSICIAN:  Lenon Curt. Chilton Si, M.D.  DATE OF BIRTH:  06/17/1925   DATE OF ADMISSION:  11/17/2005  DATE OF DISCHARGE:                                HISTORY & PHYSICAL   CHIEF COMPLAINT:  Weakness, slurred speech, could not walk   HISTORY OF PRESENT ILLNESS:  Patient woke up this morning feeling weak to  the point she could not walk, then became much more slurred with her speech.  Daughter who is at bedside says patient actually has not felt very well for  the last couple weeks.  She was quite tired yesterday and had a slight  headache.  Over the last six weeks, she has been treated for an inner ear  problem with loss of balance.  Patient has been excessively drowsy for the  last few weeks.   PAST MEDICAL HISTORY:  Osteoporosis with history of fractured vertebra,  hypertension, hyperlipidemia, macular degeneration, legal blindness, poor  dentition, inner ear problems as noted above for about 6 weeks six weeks  recently.   FAMILY HISTORY:  Five children, son Brett Canales has hypertension, Harvie Heck has  hypertension, Lorin Picket has missing heart valve, Trey Paula had abnormally narrow  valve, [name] has hypertension.  History of patient's brothers and sisters is  not well known, nor is her parents.   SOCIAL HISTORY:  She lives alone.  She talks to her husband, although they  are divorced for at least 25-30 years.  Patient is quite competent and self-  sufficient usually.  No tobacco or alcohol.  Her son does live behind her  and checks on her.   ALLERGIES:  None.   MEDICATIONS:  Lotrel 10/20 once daily, Valium 2 mg daily, Ibuprofen mg  t.i.d., Zocor 40 mg daily, Requip uncertain dose, Fosamax 70 mgm weekly.   REVIEW OF SYSTEMS:  GENERAL:  No weight loss, no recent fevers or chills.  Had been doing well other than the  symptoms noted above over the last few  weeks.  HEENT:  No recent acute visual change, no diplopia. No history of  facial droop.  PULMONARY:  denies cough, wheeze, asthma, COPD.  HEART:  No  arrhythmias.  Patient had a 2D echocardiogram done at [facility]  interpreted by Dr. Yates Decamp about two years ago as part of a family work-up  because of the valve disorders of the two sons.  The echocardiogram done on  this patient showed normal left ventricular systolic function with an  ejection fraction of 60% with mild to moderate concentric left ventricular  hypertrophy and moderate basoseptal hypertrophy.  There is no evidence of  left ventricular outflow tract obstruction.  There is mild aortic valve  sclerosis and mild aortic valve regurgitation.  Moderate calcification was  noted at the posterior mitral leaflet, but there was no evidence of mitral  valve stenosis.  There was mitral regurgitation to a mild degree as well as  mild tricuspid valve regurgitation and a moderate pulmonary hypertension.  GASTROINTESTINAL:  Denies history of hepatitis, duodenal ulcer disease,  recurrent reflux problems, hiatal hernia, or diarrhea.  Has never passed any  blood.  She does have a little bit of constipation.  GENITOURINARY:  Patient  has occasional urinary incontinence.  No dysuria, has never had any surgery  in the genitourinary area.  MUSCULOSKELETAL:  Has some recurrent back pain.  During episode a few years ago, she was noted to have a fracture of the  fifth lumbar vertebrae. In the course of evaluating her, her x-rays  suggested the possibility of a tumor in this area so she underwent biopsy.  The final pathology report was negative for malignancy.  She has recovered  from her fractures.  She does take Fosamax weekly.  NEUROLOGIC:  No prior  history of CVA, tremor, focal weakness, seizures, or other neurologic  deficits.  CIRCULATION:  No complaints of  painful or cold extremities or  circulatory  problems.   PHYSICAL EXAMINATION:  VITALS:  Temperature 98.2, pulse originally noted at  96, then dropped to 71,  atrial fibrillation, respirations 16, blood  pressure was initially recorded 154/104 and dropped to 139/69.  GENERAL:  Mesomorphic elderly white female in no acute distress  SKIN:  Without petechiae, purpura or evidence of trauma, or ecchymoses.  NODES:  None palpable in the cervical, supraclavicular, or inguinal areas.  HEENT:  Pupils equal round, reactive to light and accommodation.  Macular  degeneration is noted.  Vision is poor.  Hearing is normal. Pinnae, external  auditory canals, and membranes are normal.  Oropharynx shows poor dentition,  but no new problems.  NECK:  Supple.  No thyromegaly, nodule, bruit or adenopathy.  CHEST:  Clear to auscultation and percussion.  BREASTS:  Pendulous, atrophic, without nodules or tenderness.  HEART:  Atrial fibrillation without gallop, murmur, click, rub, heave or  thrill.  ABDOMEN:  Obese, nontender, no organomegaly, mass or bruit.  GENITOURINARY:  Deferred.  EXTREMITIES:  Moves all four symmetrically.  No focal weakness noted.  NEUROLOGIC:  Left facial droop is quite evident.  There is no evidence of  tremor.  Babinski shows downgoing great toes bilaterally.  Patient is lucid,  has mild dysarthria.  She was able to answer much of her medical history.   LABORATORY DATA:  ISTAT-8 normal.  Creatinine 1.4, INR 1, myoglobin 180, MB  8.9, troponin I less than 0.05.  EKG showed atrial fibrillation at the rate  of 71.  Chest x-ray showed cardiomegaly and left lower lobe atelectasis. CT  brain showed atrophy, small vessel disease, age-indeterminate lacunar  infarct.   IMPRESSION:  1.  Cerebrovascular accident.  2.  Atrial fibrillation.  3.  Hypertension.  4.  Osteoarthritis.  5.  Osteoporosis.  6.  Hyperlipidemia.  PLAN:  Dr. Thad Ranger, neurologist, has already been consulted and will  consult the [facility] (who  does her son's cardic care).  Patient will be started on heparin per recommendation of Dr. Thad Ranger, to be  followed up with Coumadin.  MRA of the brain is to be scheduled as well as  carotid Doppler and 2D echocardiogram.      Lenon Curt. Chilton Si, M.D.  Electronically Signed     AGG/MEDQ  D:  11/17/2005  T:  11/17/2005  Job:  [id number]   cc:   Casimiro Needle L. Thad Ranger, M.D.  Fax: [phone]   Corrie Mckusick, M.D.  Fax: [phone]

## 2010-10-12 NOTE — Discharge Summary (Signed)
[name], [name]           ACCOUNT NO.:  [id number]   MEDICAL RECORD NO.:  [id number]          PATIENT TYPE:  INP   LOCATION:  3707                         FACILITY:  [facility]   PHYSICIAN:  Lonia Blood, M.D.DATE OF BIRTH:  [birth year]   DATE OF ADMISSION:  11/17/2005  DATE OF DISCHARGE:  11/22/2005                                 DISCHARGE SUMMARY   PRIMARY CARE PHYSICIAN:  Dr. Phillips Odor [place]   CARDIOLOGIST:  [facility]   DISCHARGE DIAGNOSES:  1.  Acute multifocal thromboembolic cerebrovascular accident involving the      right hemisphere at the posterior frontal region, superior temporal, and      parietal cortex.      1.  Newly diagnosed/new onset atrial fibrillation.      2.  Hyperlipidemia.      3.  Hyperhomocysteinemia.      4.  Negative carotid Dopplers.      5.  Heparin transitioned to Coumadin.  2.  Newly diagnosed atrial fibrillation.      1.  Autoventricular response controlled.      2.  Heparin to Coumadin.      3.  Possible candidate for [facility] in four to six weeks.      4.  Follow up with [facility] at discretion of primary care          physician.  3.  Hyperhomocysteinemia - Foltx initiated.  4.  Hypertension.  5.  Hyperlipidemia.  6.  Macular degeneration.  7.  Dental caries.  8.  Osteoporosis with history of fractured vertebra.   DISCHARGE MEDICATIONS:  1.  Norvasc 10 mg daily.  2.  Lotensin 20 mg daily.  3.  Zocor 40 mg q.h.s.  4.  Coumadin 2 mg q.h.s.  5.  Foltx one daily.  6.  Valium 2.5 mg q.h.s. p.r.n.  7.  Requip 1 mg q.h.s. p.r.n.  8.  Lumigan ophthalmic solution 0.03% one drop in each eye every night.   FOLLOW-UP:  1.  Patient will follow up in the [facility] in [place],      [place] at 9:00 in the morning on Monday, July 2 for recheck of      her INR.  At time of discharge INR is 2.2 with decrease of Coumadin dose      from her loading of 5 mg daily to 2 mg daily at  discharge.  2.  The patient is instructed to follow up with her primary care physician,      Dr. Phillips Odor in approximately seven to 10 days.  At that time we will      need to ensure that the patient's blood pressure is well controlled.  In      six to eight weeks we will need to ensure that patient's LFTs are      unchanged on her Zocor.  They were normal at discharge.  BMET is      recommended to assure the patient is tolerating her Lotensin therapy.   CONSULTS:  1.  [facility]  2.  Stroke service   PROCEDURES:  1.  CT scan of the head November 17, 2005:  No acute findings, but atrophy with      extensive small vessel disease noted.  2.  MRI of the head November 18, 2005:  Multifocal areas of acute infarction in      the right hemisphere involving posterior frontal cortex, superior      temporal, and parietal cortex.  Atrophy with chronic microvascular      ischemic change.  3.  Transesophageal echocardiogram - Left ventricular systolic function      normal.  Ejection fraction 55-65%.  No wall motion abnormalities.  No      evidence of significant diastolic dysfunction.  4.  Bilateral carotid artery Dopplers:  No significant internal carotid      artery stenosis.   HISTORY OF PRESENT ILLNESS:  For history of present illness please see  history and physical dictated by Dr. Frederik Pear November 17, 2005 labeled job  #[id number].   HOSPITAL COURSE:  Ms. [name] is a very pleasant 77 year old  female who lives with her daughter in [place].  She presented to the  hospital on date of admission with complaints of weakness, mental status  change, and slurred speech.  Patient also noted she was significantly weak  in the left leg.  At initial presentation CT scan of the head was  unremarkable.  It was appreciated, however, that the patient was in an  atrial fibrillation.  She had no prior history of atrial fibrillation.  She  was placed on intravenous heparin.  MRI of the head was  obtained to rule out  acute CVA.  This revealed multiple areas of acute CVA within the right  hemisphere as detailed above.  These findings were consistent with  thromboembolic strokes and were felt to be related to her newly diagnosed  atrial fibrillation.  Echocardiogram was accomplished.  There was no  evidence of intracardiac source or thrombi at that time and LV systolic  function was preserved.  Carotid Dopplers were obtained and were  unremarkable.  Homocysteine level was obtained and was noted to be markedly  elevated consistent with hyperhomocysteinemia.  LFTs were obtained and  revealed an elevated LDL at 113.  Zocor was initiated.  Foltx was initiated.  Blood pressure was noted to be elevated as well.  Aggressive blood pressure  titration was carried out.  In regard to the patient's atrial fibrillation  her ventricular response rate remained controlled throughout her entire  hospitalization.  This was felt to be reflective of an underlying  arrhythmia.  The patient was remained stable throughout the rest of her  hospitalization.  Her symptoms of left-sided facial droop and left lower  extremity weakness improved significantly with above-listed treatment.  Physical therapy and occupational therapy were asked to evaluate the  patient.  Patient was deemed to be stable for discharge home and only  required a walker for assistance.  At such time that the patient's INR was  greater than 2 her heparin was discontinued.  The patient was cleared for  discharge home.  She will follow up in the [facility] as noted  above for ongoing titration of her Coumadin.  This patient is considered a  reasonable candidate for direct current cardioversion.  She will not be a  candidate for such until she  has been therapeutic on her INR for four to six weeks.  At that time  decision to pursue this will be left to the discretion of the patient's  primary care physician.  If the patient, her  family, and the physician do  wish to pursue this she should be referred back to [facility] for  reevaluation in the outpatient setting.      Lonia Blood, M.D.  Electronically Signed     JTM/MEDQ  D:  11/22/2005  T:  11/22/2005  Job:  [id number]   cc:   Corrie Mckusick, M.D.  Fax: [phone]   [place] Bing, M.D. [facility]  [address]  [address]  [place]  [place] [address]   Rollene Rotunda, M.D.  [address]  [address]  [place]  [place] [address]

## 2010-10-12 NOTE — Consult Note (Signed)
[name], [name]           ACCOUNT NO.:  [id number]   MEDICAL RECORD NO.:  [id number]          PATIENT TYPE:  INP   LOCATION:  3707                         FACILITY:  [facility]   PHYSICIAN:  [name], M.D.DATE OF BIRTH:  Apr 20, 1926   DATE OF CONSULTATION:  11/17/2005  DATE OF DISCHARGE:                                   CONSULTATION   REQUESTING PHYSICIAN:  [name] room.   REASON FOR EVALUATION:  Stroke.   HISTORY OF PRESENT ILLNESS:  This is the initial inpatient consultation  evaluation of this 77 year old woman with a past medical history which  includes hypertension and hypercholesterolemia.  The patient says that she  has not felt well the last few days but did not have any particular  neurologic problems until this morning.  Upon awakening she was found to  have some facial asymmetry and some slurring of her speech.  The patient  reportedly seemed normal last night, but this was noticed when a friend came  to pick her up to take her to church.  The patient also seemed a little bit  unsteady on her feet, question of some left-sided weakness was raised.  The  patient notified her primary physician who advised her to come to [name] for further evaluation.  The patient says she has never had any  symptoms like this before.  She denies any associated difficulty with  swallowing.  Does note that she is having a little bit of slurred speech.  She denies any associated chest pain, nausea, vomiting, shortness of breath,  or alteration of consciousness.  She was found incidentally while here to be  in atrial fibrillation.   PAST MEDICAL HISTORY:  Hypertension and hypercholesterolemia for which she  is on medication.  She also has significant problems with cramps in her legs  at night and is treated for restless leg syndrome.   FAMILY HISTORY:  Remarkable for stroke in her mother.   SOCIAL HISTORY:  Lives alone and is normally independent in  her activities  of daily living.  She does not smoke.   ALLERGIES:  NONE.   MEDICATIONS:  1.  Lotrel.  2.  Vytorin.  3.  Valium 2 mg q.h.s.  4.  Requip uncertain dose q.h.s.  5.  Ibuprofen 600 mg t.i.d.   REVIEW OF SYSTEMS:  As noted above, she has not felt well in the last few  days, complaining of being tired and also with some congestion.  She had a  little bit of abdominal pain yesterday and a mild headache.  Beyond that, a  full ten-system review of systems is negative except as outlined in the HPI  and in the admission H&P and in the admission nursing records.   PHYSICAL EXAMINATION:  VITAL SIGNS:  Temperature 98.2, blood pressure  139/69, pulse 71, respirations 18, O2 sat 100% on two liters O2 nasal  cannula.  GENERAL EXAMINATION:  This is a healthy-appearing woman supine in the  Daniel bed in evident distress.  HEAD:  Cranium is normocephalic, atraumatic.  Oropharynx benign.  NECK:  Supple without carotid or supraclavicular  bruits.  HEART:  Irregularly irregular rhythm with no murmurs.  NEUROLOGIC EXAM:  Mental status:  She is awake, alert, and fully oriented to  time, place and person.  Recent and remote memory are intact.  Attention  span, concentration and fund of knowledge are all adequate.  Speech is  fluent and not dysarthric.  She has no defects of confrontational naming and  can repeat a phrase.  Cranial nerves:  Funduscopic exam is benign.  Pupils  are equal and briskly reactive.  Extraocular movements full without  nystagmus.  Visual fields full to confrontation.  Visual acuity is quite  poor bilaterally.  Hearing is intact to conversational speech.  There is a  left facial droop with obvious weakness.  Tongue and palate move normally  and symmetrically.  She does report decreased sensation on the left side of  the face compared to the right.  Motor:  Normal bulk and tone.  She has very  mild weakness of intrinsic hand muscles on the left as well as left hip   flexors.  Otherwise, normal strength throughout.  Sensation:  Intact to  pinprick and light touch in all extremities.  Coordination:  Rapid movements  are performed adequately.  Finger-to-nose is performed adequately.  Gait:  She rises slowly from the chair.  She is able to ambulate with minimal  assist without much difficulty.  Reflexes 2+ and symmetric.  Toes are  downgoing bilaterally.   LABORATORY REVIEW:  BMP is unremarkable.  BUN and creatinine are 17 and 1.4  respectively.  Glucose is normal at 88.  She has not had a CBG.  Cardiac  enzymes show elevated CK and MB with normal index and negative troponin.  CT  of the head is personally reviewed and demonstrates fairly extensive white  matter disease with some mild atrophy.  There is no definite acute infarct.  Question is raised of an age-indeterminate lesion in the anterior limb of  the right internal capsule.   IMPRESSION:  1.  Acute right brain subcortical stroke with left facial droop and minimal      hemiparesis with mild dysarthria.  Known risk factors include      hypertension, new-onset atrial fibrillation and hypercholesterolemia.  2.  Atrial fibrillation with controlled rate.  Apparently new in onset.   RECOMMENDATIONS:  Stroke workup would include MRI of the brain with MRA of  the intracranial circulation as well as carotid and transcranial Dopplers, 2-  D echocardiogram, telemetry monitoring, and stroke labs to include fasting  lipid panel, a fasting homocystine.  Would also check hemoglobin A1c if her  glucose is elevated, although it has not been to this point.  She needs to  be strictly n.p.o. until speech therapy can perform a swallow evaluation in  the morning, and then a diet can be started.  For stroke prophylaxis, if she  remains in atrial fibrillation she will need intravenous heparin and  eventually to be switched over to Coumadin with a therapeutic INR of 2 to 3. Stroke service to follow.   Thank you for  the consultation.      [name], M.D.  Electronically Signed     MLR/MEDQ  D:  11/17/2005  T:  11/18/2005  Job:  [id number]   cc:   [name], M.D.  Fax: [phone]

## 2010-10-12 NOTE — Consult Note (Signed)
[name], [name]           ACCOUNT NO.:  [id number]   MEDICAL RECORD NO.:  [id number]          PATIENT TYPE:  INP   LOCATION:  3707                         FACILITY:  [facility]   PHYSICIAN:  Verne Grain, MD   DATE OF BIRTH:  07-01-1925   DATE OF CONSULTATION:  11/17/2005  DATE OF DISCHARGE:                                   CONSULTATION   CONSULT FROM:  Dr. Chilton Si   CONSULT TO:  Verne Grain, MD/Thomas C. Wall, M.D. [facility])   REASON FOR CONSULTATION:  New atrial fibrillation in the setting of stroke  symptoms (weakness and slurred speech).   HISTORY OF PRESENT ILLNESS:  A 77 year old female with history of  hypertension, hyperlipidemia, snuff use, otherwise no significant past  medical history, lives alone, independent, was noted to have some mild  abdominal discomfort and headache last evening of unclear etiology and  significance.  This morning, the patient was picked up at approximately 9:30  a.m. for church and was commented on as not looking quite right.  The  patient subsequently fell asleep in church which was very unusual for her.  She woke up with slurred speech, glassy-appearing, with a possible left  facial droop and weakness with inability to stand.  The patient was  subsequently brought to the [facility] Emergency Room at approximately 12:45  p.m.  She underwent a head CT which revealed chronic atrophy and white  matter changes as well as right basal ganglia lacune of unclear duration.  She was treated with heparin for presumed stroke after evaluation by  neurology.  She reportedly has ongoing weakness with some slurred speech but  overall has been much improved since arrival to the emergency room per the  patient's daughter.  She had an EKG which revealed evidence of atrial  fibrillation which has not been diagnosed previously.  She has a ventricular  rate of approximately 70-80 on no AV nodal blockers.  She had an  echocardiogram in 2005  which showed no significant valvular disease and  normal left ventricular ejection fraction.  She had one set of CK-MB and  troponin checked in the emergency room with negative troponin and a slightly  over the upper limit of normal at 6.8.   PAST MEDICAL HISTORY:  1.  Hypertension.  2.  Hyperlipidemia.  3.  Osteoporosis.  4.  History of macular degeneration.  5.  Glaucoma.  6.  History of echocardiogram December 2005 with left ventricular ejection      fraction 60%, mild to moderate left ventricular hypertrophy, moderate      mitral annular calcification particularly of the posterior leaflet with      mild mitral regurgitation and notation of moderate pulmonary      hypertension, mild tricuspid regurgitation, aortic sclerosis without      stenosis.   FAMILY HISTORY:  The patient has two sons with valvular heart disease, one  of whom has a missing heart valve and one son who has a valve that does  not open correctly (relayed by the patient's daughter).  The patient also  has two sons and one daughter  who all have hypertension but no known  structural heart disease.  The patient's mother died in her 81s of multiple  strokes.  The patient's father lived into his 59s and died of old age.   SOCIAL HISTORY:  The patient lives alone.  She is independent in her ADLs.  She resides in [place], [place].  She reports use of snuff but no cigarettes  and no alcohol use in her past.   REVIEW OF SYSTEMS:  Notable only for mild headache and abdominal discomfort.  Symptoms last night, some of which have persisted into today.  Otherwise,  reports no recent fever or chills.  Neurologic symptoms as outlined in the  HPI with slurred speech, generalized weakness and glassy appearance.  No  acute alterations in visual acuity.  No recent weight gain.  No report of  heat or cold loss.  No report of acute rash.  No report of acute joint  complaints.  No bowel or bladder complaints.  No fever or chills.  All  other  systems are negative other than described in past medical history and HPI.   PHYSICAL EXAMINATION:  Temperature 98.2, oxygen saturation 96% on room air,  heart rate 70s to 80s, respiratory rate 18, blood pressure 140s-150s/80s-  90s.  The patient is pleasant.  Does have somewhat of a masked facies.  She  is normocephalic, atraumatic with suboptimal dentition.  Extraocular eye  movements intact and pupils round bilaterally.  NECK:  Supple with no bruit.  LUNG:  Fields are mostly clear to auscultation bilaterally.  CARDIOVASCULAR:  Examination is regular S1, regular S2 with faint systolic  ejection murmur along the right sternal border.  ABDOMEN:  Soft, nontender, nondistended with positive bowel sounds although  there is some mild discomfort.  The abdomen is soft, minimal tender to deep  palpation diffusely.  Positive bowel sounds.  LOWER EXTREMITIES:  Examination reveals no evidence of edema, 2+ pulses  bilaterally.  SKIN:  Limited skin examination without acute rash.  NEUROLOGIC:  Limited exam notable for some speech slurring and generalized  weakness with inability to stand, masked facies as mentioned above.   ALLERGIES/ADVERSE REACTIONS:  NO KNOWN DRUG ALLERGIES.   CURRENT MEDICATIONS:  1.  Norvasc 10 mg p.o. daily.  2.  Lotensin 20 mg p.o. daily.  3.  Motrin 600 mg p.o. t.i.d.  4.  Zocor 40 mg p.o. at bedtime.  5.  Heparin drip.  6.  Tylenol and Valium p.r.n.   LABORATORY VALUES:  The pH is 7.4; hematocrit 43; sodium 141, potassium 4.2,  chloride 110, bicarbonate 35, BUN 17, creatinine 1.4, glucose 88, anion gap  4; INR 1.  Myoglobin 180, CK 413, CK-MB 6.8, troponin less than 0.06.   EKG atrial fibrillation with left axis deviation and nondiagnostic ST and T-  wave abnormalities.  Some repolarization abnormalities attributable to left  ventricular hypertrophy.   Chest x-ray:  Cardiomegaly with left lower lobe atelectasis.  Head CT:  Atrophy with white matter  disease and right basal ganglia lacune  of unclear chronology.   IMPRESSION:  1.  New onset atrial fibrillation with symptoms of acute stroke.  2.  Low positive CK-MB and negative troponin of unclear significance.   RECOMMENDATIONS:  1.  Continue heparin with plan to transition her to Coumadin when      neurologically stable (goal INR 2-3) x at least 4-6 weeks.  If still in      atrial fibrillation at that time and good recovery from the  stroke,      could consider TEE and DC cardioversion at that point with careful      monitoring for decreased heart rate post cardioversion.  The patient's      rate is currently adequately controlled with no AV nodal blockers      suggestive of underlying conduction system disease.  We will hold AV      nodal blockers unless heart rate is greater than 90 for a period or for      heart rates that significantly increase with usual activity.  2.  Continue to cycle CK-MB and troponin to rule out myocardial infarction.  3.  Check TSH, free T4, lipid profile, magnesium, calcium, LFTs with morning      labs.  Continue Zocor as previously prescribed.  4.  Check an echocardiogram in the morning to confirm ongoing normal      ejection fraction and absence of any significant valvular heart disease      or pericardial effusion.  5.  If the patient has good recovery from the stroke (as described above),      could consider a nuclear stress perfusion study prior to TEE, DC      cardioversion to rule out ischemia; however, will defer consideration of      this test to the discretion of the outpatient cardiologist that will be      following this patient as she has had no clear symptoms of ischemia      prior to her episode prompting this current hospitalization.           ______________________________  Verne Grain, MD     DDH/MEDQ  D:  11/18/2005  T:  11/18/2005  Job:  [id number]

## 2010-10-12 NOTE — Procedures (Signed)
NAME:  [name], [name]                     ACCOUNT NO.:  [id number]   MEDICAL RECORD NO.:  [id number]                   PATIENT TYPE:  OUT   LOCATION:  RAD                                  FACILITY:  [facility]   PHYSICIAN:  [name] R. Jacinto Halim, M.D.                  DATE OF BIRTH:  26-Jan-1926   DATE OF PROCEDURE:  12/06/2003  DATE OF DISCHARGE:                                  ECHOCARDIOGRAM   PROCEDURE:  Doppler echocardiogram.   CARDIOLOGIST:  Cristy Hilts. Jacinto Halim, M.D.   INDICATION:  Abnormal heart sounds and hypertension.   REFERRING PHYSICIAN:  Corrie Mckusick, M.D.   1. Aortic root is 2.2 cm.  2. Left atrium 4.0 cm.  3. Septum 1.4 cm.  4. Posterior 1.4 cm.  5. LV diastolic dimension 3.6 cm.  6. LV systolic dimension 2.2 cm.  7. IVC normal.  8. Ejection fraction 60%.   1. LEFT ATRIUM:  The left atrium is normal.  2. LEFT VENTRICLE:  The left ventricle shows mild to moderate left     ventricular concentric hypertrophy.  There is moderate basal septal     hypertrophy.  No evidence of left ventricular outflow tract obstruction.  3. RIGHT ATRIUM:  The right atrium normal.  4. RIGHT VENTRICLE:  The right ventricle is normal.  5. PERICARDIUM:  The pericardium is normal.  6. AORTIC VALVE:  The aortic valve shows mild aortic valve sclerosis.  The     aortic valve opens well.  The peak gradient across the aortic valve is 13     mmHg with a mean of 6 mmHg.  There is mild aortic valve dilatation.  7. MITRAL VALVE:  The mitral valve shows mild to moderate mitral annular     calcification. There is mild to moderate calcification of the posterior     mitral leaflet but no evidence of mitral valve stenosis.  There is mild     mitral valve regurgitation.  8. PULMONARY VALVE:  The pulmonary valve is not well visualized.  9. TRICUSPID VALVE:  The tricuspid valve is normal.  There is mild tricuspid     valve regurgitation.  Pulmonary artery systolic pressure is around 40     mmHg.   FINDINGS:  1.  Normal left ventricular systolic function.  Ejection fraction is 60%.  2. Mild to moderate concentric left ventricular hypertrophy.  Moderate basal     septal hypertrophy.  No evidence of left ventricular outflow tract     obstruction.  3. Mild aortic valve sclerosis.  Mild aortic valve regurgitation.  4. Moderate calcification of posterior mitral leaflet, no evidence of mitral     valve stenosis.  Mild mitral regurgitation.  5. Mild tricuspid valve regurgitation and moderate pulmonary hypertension.      ___________________________________________  Cristy Hilts. Jacinto Halim, M.D.   Pilar Plate  D:  12/06/2003  T:  12/06/2003  Job:  [id number]   cc:   Corrie Mckusick, M.D.  [address]  [place]  [place] [address]  Fax: [phone]

## 2010-10-12 NOTE — Letter (Signed)
December 10, 2005    Judithann Sheen, MD  [address], [address]  [place], [place] [address]   RE:  TIMEA, BREED  MRN:  [id number]  /  DOB:  [birth year]   Dear [name]:   It was my pleasure evaluating Ms. Rainford in consultation today in the  office, at your request, for atrial fibrillation.  As you know, this nice  woman was recently admitted to [facility] with a right  cerebrovascular accident.  She was also found to have newly diagnosed atrial  fibrillation with a controlled ventricular response despite the absence of  rate control medications.  She was anticoagulated and recovered well from  her neurologic deficit. She is now referred for continuing management of  anticoagulation.   Ms. Vincelette has no known cardiovascular disease.  She has had  hyperlipidemia and hypertension.  She has not used tobacco products. There  is no history of diabetes.   PAST MEDICAL HISTORY:  Is otherwise notable for osteoporosis and macular  degeneration. She was last hospitalized in 1975 for a cholecystectomy.   ALLERGIES:  She has no known drug allergies.   CURRENT MEDICATIONS:  Include simvastatin 40 mg daily, Lotrel 10/10 mg  daily, warfarin as directed. Requip 1 mg q.h.s., and Valium 2.5 mg p.r.n.   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   FAMILY HISTORY:  Father died at age 75 of unclear reasons. Mother died at  age 75 due to CVA. She has three brothers, two of whom have known cardiac  problems.  Five sisters, two of whom are deceased from cardiovascular  disease and two of the three living sisters have heart problems.   REVIEW OF SYSTEMS:  Is notable for the need for corrective lenses, glaucoma  with macular degeneration and previously excised cataracts from both eyes,  occasional palpitations, intermittent constipation, arthritic discomfort in  the hands and hips. She has chronic pedal edema. All other systems reviewed  and  are negative.   EXAMINATION:  A pleasant older woman in no acute distress.  The weight is  148, blood pressure 135/80, heart rate 85 and irregular.  HEENT:  Anicteric sclerae; normal lids and conjunctivae. Normal oral mucosa.  NECK:  No jugular venous distension; normal carotid upstrokes without  bruits.  HEMATOLOGIC:  No adenopathy.  ENDOCRINE:  No thyromegaly.  LUNGS:  Clear.  CARDIAC:  Normal first and second heart sounds, irregular rhythm.  ABDOMEN:  Soft and nontender; no masses; no organomegaly; aortic pulsation  not palpable.  EXTREMITIES:  Distal pulses intact; 2+ ankle and pretibial edema.   EKG:  Atrial fibrillation with controlled ventricular response; delayed R  wave progression - cannot exclude prior septal myocardial infarction;  leftward axis; left ventricular hypertrophy with repolarization abnormality.  No prior tracing for comparison.   The patient's hospital records were obtained and reviewed.  The  echocardiogram showed mild LVH with preserved left ventricular systolic  function and no significant valvular disease.  Mild aortic insufficiency and  mitral regurgitation were present.   Carotid ultrasound was also performed.  No significant focal stenosis was  apparent.   IMPRESSION:  Ms. Penning has atrial fibrillation of uncertain onset. She  was last known to be in sinus rhythm approximately 5 or 6 years ago. Since  she is asymptomatic, and she may have had prolonged atrial fibrillation, I  do not believe that cardioversion would likely be worthwhile.  A TSH will be  obtained since I can  find no record of a previous value.  She will require  chronic anticoagulation, which we will be happy to manage. The fact that she  does not require rate control medication indicates a degree of conduction  system disease, which may some day require permanent pacing.   Your referral of this nice woman is greatly appreciated.  Please let me know  at any time that I can assist  in her care.   Sincerely,     Gerrit Friends. Dietrich Pates, MD, [facility]  RMR/MedQ  DD:  12/10/73  DT:  12/10/2005 Job #:  [id number]

## 2010-10-31 ENCOUNTER — Ambulatory Visit (INDEPENDENT_AMBULATORY_CARE_PROVIDER_SITE_OTHER): Payer: Medicare Other | Admitting: *Deleted

## 2010-10-31 DIAGNOSIS — I4891 Unspecified atrial fibrillation: Secondary | ICD-10-CM

## 2010-10-31 DIAGNOSIS — Z7901 Long term (current) use of anticoagulants: Secondary | ICD-10-CM

## 2010-11-21 ENCOUNTER — Ambulatory Visit (INDEPENDENT_AMBULATORY_CARE_PROVIDER_SITE_OTHER): Payer: Medicare Other | Admitting: *Deleted

## 2010-11-21 DIAGNOSIS — Z7901 Long term (current) use of anticoagulants: Secondary | ICD-10-CM

## 2010-11-21 DIAGNOSIS — I4891 Unspecified atrial fibrillation: Secondary | ICD-10-CM

## 2010-12-19 ENCOUNTER — Ambulatory Visit (INDEPENDENT_AMBULATORY_CARE_PROVIDER_SITE_OTHER): Payer: Medicare Other | Admitting: *Deleted

## 2010-12-19 DIAGNOSIS — I4891 Unspecified atrial fibrillation: Secondary | ICD-10-CM

## 2010-12-19 DIAGNOSIS — Z7901 Long term (current) use of anticoagulants: Secondary | ICD-10-CM

## 2011-01-16 ENCOUNTER — Encounter: Payer: Medicare Other | Admitting: *Deleted

## 2011-01-18 ENCOUNTER — Ambulatory Visit (INDEPENDENT_AMBULATORY_CARE_PROVIDER_SITE_OTHER): Payer: Medicare Other | Admitting: *Deleted

## 2011-01-18 DIAGNOSIS — Z7901 Long term (current) use of anticoagulants: Secondary | ICD-10-CM

## 2011-01-18 DIAGNOSIS — I4891 Unspecified atrial fibrillation: Secondary | ICD-10-CM

## 2011-01-18 LAB — POCT INR: INR: 2.2

## 2011-01-19 ENCOUNTER — Other Ambulatory Visit: Payer: Self-pay | Admitting: Cardiology

## 2011-02-15 ENCOUNTER — Encounter: Payer: Medicare Other | Admitting: *Deleted

## 2011-02-22 ENCOUNTER — Ambulatory Visit (INDEPENDENT_AMBULATORY_CARE_PROVIDER_SITE_OTHER): Payer: Medicare Other | Admitting: *Deleted

## 2011-02-22 DIAGNOSIS — I4891 Unspecified atrial fibrillation: Secondary | ICD-10-CM

## 2011-02-22 DIAGNOSIS — Z7901 Long term (current) use of anticoagulants: Secondary | ICD-10-CM

## 2011-02-22 LAB — POCT INR: INR: 2.6

## 2011-03-22 ENCOUNTER — Ambulatory Visit (INDEPENDENT_AMBULATORY_CARE_PROVIDER_SITE_OTHER): Payer: Medicare Other | Admitting: *Deleted

## 2011-03-22 DIAGNOSIS — I4891 Unspecified atrial fibrillation: Secondary | ICD-10-CM

## 2011-03-22 DIAGNOSIS — Z7901 Long term (current) use of anticoagulants: Secondary | ICD-10-CM

## 2011-04-19 ENCOUNTER — Ambulatory Visit (INDEPENDENT_AMBULATORY_CARE_PROVIDER_SITE_OTHER): Payer: Medicare Other | Admitting: *Deleted

## 2011-04-19 DIAGNOSIS — I4891 Unspecified atrial fibrillation: Secondary | ICD-10-CM

## 2011-04-19 DIAGNOSIS — Z7901 Long term (current) use of anticoagulants: Secondary | ICD-10-CM

## 2011-04-19 LAB — POCT INR: INR: 3.1

## 2011-04-22 ENCOUNTER — Other Ambulatory Visit: Payer: Self-pay | Admitting: Cardiology

## 2011-04-22 MED ORDER — WARFARIN SODIUM 2 MG PO TABS: ORAL_TABLET | ORAL | Status: DC

## 2011-04-22 NOTE — Telephone Encounter (Signed)
rx request 

## 2011-05-17 ENCOUNTER — Encounter: Payer: Medicare Other | Admitting: *Deleted

## 2011-05-23 ENCOUNTER — Encounter: Payer: Self-pay | Admitting: Cardiology

## 2011-05-23 ENCOUNTER — Ambulatory Visit (INDEPENDENT_AMBULATORY_CARE_PROVIDER_SITE_OTHER): Payer: Medicare Other | Admitting: *Deleted

## 2011-05-23 DIAGNOSIS — I4891 Unspecified atrial fibrillation: Secondary | ICD-10-CM

## 2011-05-23 DIAGNOSIS — Z7901 Long term (current) use of anticoagulants: Secondary | ICD-10-CM

## 2011-05-23 LAB — POCT INR: INR: 1.9

## 2011-06-18 ENCOUNTER — Ambulatory Visit (INDEPENDENT_AMBULATORY_CARE_PROVIDER_SITE_OTHER): Payer: Medicare Other | Admitting: *Deleted

## 2011-06-18 DIAGNOSIS — Z7901 Long term (current) use of anticoagulants: Secondary | ICD-10-CM

## 2011-06-18 DIAGNOSIS — I4891 Unspecified atrial fibrillation: Secondary | ICD-10-CM

## 2011-07-16 ENCOUNTER — Ambulatory Visit (INDEPENDENT_AMBULATORY_CARE_PROVIDER_SITE_OTHER): Payer: Medicare Other | Admitting: *Deleted

## 2011-07-16 DIAGNOSIS — I4891 Unspecified atrial fibrillation: Secondary | ICD-10-CM

## 2011-07-16 DIAGNOSIS — Z7901 Long term (current) use of anticoagulants: Secondary | ICD-10-CM

## 2011-08-06 ENCOUNTER — Ambulatory Visit (INDEPENDENT_AMBULATORY_CARE_PROVIDER_SITE_OTHER): Payer: Medicare Other | Admitting: *Deleted

## 2011-08-06 DIAGNOSIS — Z7901 Long term (current) use of anticoagulants: Secondary | ICD-10-CM

## 2011-08-06 DIAGNOSIS — I4891 Unspecified atrial fibrillation: Secondary | ICD-10-CM

## 2011-08-06 LAB — POCT INR: INR: 2.9

## 2011-08-16 ENCOUNTER — Ambulatory Visit: Payer: Medicare Other | Admitting: Cardiology

## 2011-09-03 ENCOUNTER — Ambulatory Visit (INDEPENDENT_AMBULATORY_CARE_PROVIDER_SITE_OTHER): Payer: Medicare Other | Admitting: *Deleted

## 2011-09-03 DIAGNOSIS — I4891 Unspecified atrial fibrillation: Secondary | ICD-10-CM

## 2011-09-03 DIAGNOSIS — Z7901 Long term (current) use of anticoagulants: Secondary | ICD-10-CM

## 2011-09-06 ENCOUNTER — Ambulatory Visit (INDEPENDENT_AMBULATORY_CARE_PROVIDER_SITE_OTHER): Payer: Medicare Other | Admitting: Cardiology

## 2011-09-06 ENCOUNTER — Encounter: Payer: Self-pay | Admitting: Cardiology

## 2011-09-06 VITALS — BP 148/82 | HR 83 | Ht <= 58 in | Wt 112.0 lb

## 2011-09-06 DIAGNOSIS — E785 Hyperlipidemia, unspecified: Secondary | ICD-10-CM

## 2011-09-06 DIAGNOSIS — I4891 Unspecified atrial fibrillation: Secondary | ICD-10-CM

## 2011-09-06 DIAGNOSIS — N189 Chronic kidney disease, unspecified: Secondary | ICD-10-CM | POA: Insufficient documentation

## 2011-09-06 DIAGNOSIS — I10 Essential (primary) hypertension: Secondary | ICD-10-CM | POA: Insufficient documentation

## 2011-09-06 DIAGNOSIS — D696 Thrombocytopenia, unspecified: Secondary | ICD-10-CM

## 2011-09-06 DIAGNOSIS — I739 Peripheral vascular disease, unspecified: Secondary | ICD-10-CM

## 2011-09-06 DIAGNOSIS — Z7901 Long term (current) use of anticoagulants: Secondary | ICD-10-CM

## 2011-09-06 NOTE — Assessment & Plan Note (Signed)
CBCs and stool Hemoccults will be monitored to exclude occult GI blood loss.

## 2011-09-06 NOTE — Assessment & Plan Note (Addendum)
In the absence of known vascular disease, control of hyperlipidemia is adequate.

## 2011-09-06 NOTE — Progress Notes (Signed)
Name: [name]    DOB: 1925-06-29  Age: 78 y.o.  MR#: [id number]       PCP:  Colette Ribas, MD, MD      Insurance: @PAYORNAME @   CC:    Chief Complaint  Patient presents with  . Fatigue and lower ext edema    1 year f/u    VS BP 148/82  Pulse 83  Ht 4\' 8"  (1.422 m)  Wt 112 lb (50.803 kg)  BMI 25.11 kg/m2  SpO2 98%  Weights Current Weight  09/06/11 112 lb (50.803 kg)  06/07/10 109 lb (49.442 kg)  11/08/09 122 lb (55.339 kg)    Blood Pressure  BP Readings from Last 3 Encounters:  09/06/11 148/82  06/07/10 141/72  11/08/09 110/74     Admit date:  (Not on file) Last encounter with RMR:  05/23/2011   Allergy No Known Allergies  Current Outpatient Prescriptions  Medication Sig Dispense Refill  . acetaminophen (TYLENOL) 650 MG CR tablet Take 650 mg by mouth as needed.        Marland Kitchen alendronate (FOSAMAX) 70 MG tablet Take 70 mg by mouth every 7 (seven) days. Take in the morning with a full glass of water, on an empty stomach, and do not take anything else by mouth or lie down for the next 30 min.       . bimatoprost (LUMIGAN) 0.03 % ophthalmic drops Place 1 drop into both eyes at bedtime.        . bisacodyl (FLEET LAXATIVE) 5 MG EC tablet Take 5 mg by mouth as needed.        . cetirizine (ZYRTEC) 10 MG tablet Take 10 mg by mouth daily.        . citalopram (CELEXA) 20 MG tablet Take 20 mg by mouth daily.        Marland Kitchen COENZYME Q-10 PO Take 10 mg by mouth daily.        . diazepam (VALIUM) 2 MG tablet Take 2 mg by mouth 2 (two) times daily.        . diphenhydrAMINE (BENADRYL) 25 mg capsule Take 25 mg by mouth as needed.        Marland Kitchen DM-APAP-CPM (CORICIDIN HBP FLU) 15-500-2 MG TABS Take 2 mg by mouth as needed.        . docusate sodium (COLACE) 100 MG capsule Take 100 mg by mouth 3 (three) times daily.        Marland Kitchen donepezil (ARICEPT) 10 MG tablet Take 10 mg by mouth daily.        . furosemide (LASIX) 40 MG tablet Take 40 mg by mouth daily.        Marland Kitchen loratadine (CLARITIN) 10 MG  tablet Take 10 mg by mouth daily.        . memantine (NAMENDA) 5 MG tablet Take 5 mg by mouth 2 (two) times daily.      . metoprolol (TOPROL-XL) 25 MG 24 hr tablet Take 25 mg by mouth daily.        . Multiple Vitamin (MULTIVITAMIN) tablet Take 1 tablet by mouth daily.        . naproxen sodium (ANAPROX) 220 MG tablet Take 220 mg by mouth as needed.        Marland Kitchen rOPINIRole (REQUIP) 1 MG tablet Take 1 mg by mouth at bedtime.        Marland Kitchen warfarin (COUMADIN) 2 MG tablet Take 3mg  (1 &1/2 tabs) on Wednesday and 4mg  (2 tabs) all other days  54 tablet  2  . DISCONTD: warfarin (COUMADIN) 2 MG tablet TAKE 3 MG (1 & 1/2 TABS) ON WED AND 4 MG( 2 TABS) ALL OTHER DAYS  54 tablet  2    Discontinued Meds:    Medications Discontinued During This Encounter  Medication Reason  . warfarin (COUMADIN) 2 MG tablet Duplicate    Patient Active Problem List  Diagnoses  . MACULAR DEGENERATION  . OSTEOPOROSIS  . PERIPHERAL VASCULAR DISEASE  . Hypertension  . Hyperlipidemia  . Chronic anticoagulation  . Chronic kidney disease    LABS Anti-coag visit on 09/03/2011  Component Date Value  . INR 09/03/2011 3.2   Anti-coag visit on 08/06/2011  Component Date Value  . INR 08/06/2011 2.9   Anti-coag visit on 07/16/2011  Component Date Value  . INR 07/16/2011 3.2   Anti-coag visit on 06/18/2011  Component Date Value  . INR 06/18/2011 2.0      Results for this Opt Visit:     Results for orders placed in visit on 09/03/11  POCT INR      Component Value Range   INR 3.2      EKG Orders placed in visit on 10/26/09  . CONVERTED CEMR EKG     Prior Assessment and Plan Problem List as of 09/06/2011          Cardiology Problems   PERIPHERAL VASCULAR DISEASE   Hypertension   Hyperlipidemia     Other   MACULAR DEGENERATION   OSTEOPOROSIS   Chronic anticoagulation   Chronic kidney disease       Imaging: No results found.   FRS Calculation: Score not calculated

## 2011-09-06 NOTE — Assessment & Plan Note (Signed)
Renal function has been remarkably stable with no progression in class III chronic kidney disease over the past 15 years.  This likely portends a good prognosis with respect to renal disease.

## 2011-09-06 NOTE — Progress Notes (Signed)
Patient ID: [name], female   DOB: Jul 11, 1925, 78 y.o.   MRN: [id number]  HPI: Scheduled return visit for this very nice elderly woman with atrial fibrillation and a history of CVA.  Since her last visit, she has done fine from a cardiac standpoint.  She denies dyspnea or chest discomfort, but activity is limited.  She reports pain in the left leg that inhibits ambulation with the leg giving way at times.  She as suffered a few falls, but no injuries.  She has not required hospitalization nor evaluation in the emergency department since her last visit here nor has she developed any new medical issues.  Pedal edema has been apparent in recent days and intermittently in the past.  Prior to Admission medications   Medication Sig Start Date End Date Taking? Authorizing Provider  acetaminophen (TYLENOL) 650 MG CR tablet Take 650 mg by mouth as needed.     Yes Historical Provider, MD  alendronate (FOSAMAX) 70 MG tablet Take 70 mg by mouth every 7 (seven) days. Take in the morning with a full glass of water, on an empty stomach, and do not take anything else by mouth or lie down for the next 30 min.    Yes Historical Provider, MD  bimatoprost (LUMIGAN) 0.03 % ophthalmic drops Place 1 drop into both eyes at bedtime.     Yes Historical Provider, MD  bisacodyl (FLEET LAXATIVE) 5 MG EC tablet Take 5 mg by mouth as needed.     Yes Historical Provider, MD  cetirizine (ZYRTEC) 10 MG tablet Take 10 mg by mouth daily.     Yes Historical Provider, MD  citalopram (CELEXA) 20 MG tablet Take 20 mg by mouth daily.     Yes Historical Provider, MD  COENZYME Q-10 PO Take 10 mg by mouth daily.     Yes Historical Provider, MD  diazepam (VALIUM) 2 MG tablet Take 2 mg by mouth 2 (two) times daily.     Yes Historical Provider, MD  diphenhydrAMINE (BENADRYL) 25 mg capsule Take 25 mg by mouth as needed.     Yes Historical Provider, MD  DM-APAP-CPM (CORICIDIN HBP FLU) 15-500-2 MG TABS Take 2 mg by mouth as needed.     Yes  Historical Provider, MD  docusate sodium (COLACE) 100 MG capsule Take 100 mg by mouth 3 (three) times daily.     Yes Historical Provider, MD  donepezil (ARICEPT) 10 MG tablet Take 10 mg by mouth daily.     Yes Historical Provider, MD  furosemide (LASIX) 40 MG tablet Take 40 mg by mouth daily.     Yes Historical Provider, MD  loratadine (CLARITIN) 10 MG tablet Take 10 mg by mouth daily.     Yes Historical Provider, MD  memantine (NAMENDA) 5 MG tablet Take 5 mg by mouth 2 (two) times daily.   Yes Historical Provider, MD  metoprolol (TOPROL-XL) 25 MG 24 hr tablet Take 25 mg by mouth daily.     Yes Historical Provider, MD  Multiple Vitamin (MULTIVITAMIN) tablet Take 1 tablet by mouth daily.     Yes Historical Provider, MD  naproxen sodium (ANAPROX) 220 MG tablet Take 220 mg by mouth as needed.     Yes Historical Provider, MD  rOPINIRole (REQUIP) 1 MG tablet Take 1 mg by mouth at bedtime.     Yes Historical Provider, MD  warfarin (COUMADIN) 2 MG tablet Take 3mg  (1 &1/2 tabs) on Wednesday and 4mg  (2 tabs) all other days 04/22/11  Yes  Kathlen Brunswick, MD  No Known Allergies    Past medical history, social history, and family history reviewed and updated.  ROS: Denies orthopnea, PND, lightheadedness, palpitations or syncope.  All other systems reviewed and are negative.  PHYSICAL EXAM: BP 148/82  Pulse 83  Ht 4\' 8"  (1.422 m)  Wt 50.803 kg (112 lb)  BMI 25.11 kg/m2  SpO2 98%; family reports weights between 109-112 pounds  General-Well developed; no acute distress Body habitus-thin Neck-No JVD; no carotid bruits Lungs-clear lung fields; resonant to percussion; decreased inspiratory effort Cardiovascular-normal PMI; normal S1 and S2; irregular; minimal systolic murmur Abdomen-normal bowel sounds; soft and non-tender without masses or organomegaly Musculoskeletal-No deformities, no cyanosis or clubbing Neurologic-Normal cranial nerves; symmetric strength and tone Skin-Warm, no significant  lesions Extremities: 1-2+distal pulses; 1-2+ ankle and pretibial edema  Rhythm Strip:  Atrial fibrillation with controlled ventricular response; Average heart rate of 93 bpm  ASSESSMENT AND PLAN:  Strang Bing, MD 09/06/2011 3:40 PM

## 2011-09-06 NOTE — Assessment & Plan Note (Signed)
Blood pressure has been good when assessed in the office and even better at home, where the patient's family reports systolics approximately 120.

## 2011-09-06 NOTE — Assessment & Plan Note (Signed)
Decreased distal pulses; in the absence of symptoms, no formal testing has been undertaken.

## 2011-09-06 NOTE — Assessment & Plan Note (Signed)
Atrial fibrillation is not resulting in any apparent symptoms.  Patient has done well on a rate control and anticoagulation strategy, which will be continued.  INRs have been stable and therapeutic in recent months.

## 2011-09-06 NOTE — Patient Instructions (Signed)
Your physician recommends that you schedule a follow-up appointment in: 6 months  Your physician recommends that you return for lab work in: Within the week  Stool cards x 3 and return to office  Elevate legs as much as possible  Low salt diet - Information included

## 2011-09-10 ENCOUNTER — Encounter: Payer: Self-pay | Admitting: *Deleted

## 2011-09-10 LAB — BASIC METABOLIC PANEL
BUN: 26 mg/dL — ABNORMAL HIGH (ref 6–23)
CO2: 28 mEq/L (ref 19–32)
Chloride: 104 mEq/L (ref 96–112)
Creat: 1.27 mg/dL — ABNORMAL HIGH (ref 0.50–1.10)

## 2011-09-11 ENCOUNTER — Inpatient Hospital Stay (HOSPITAL_COMMUNITY)
Admission: EM | Admit: 2011-09-11 | Discharge: 2011-09-17 | DRG: 563 | Disposition: A | Discharge status: Skilled Nursing Facility | Payer: Medicare Other | Attending: Internal Medicine | Admitting: Internal Medicine

## 2011-09-11 ENCOUNTER — Encounter (HOSPITAL_COMMUNITY): Payer: Self-pay | Admitting: *Deleted

## 2011-09-11 ENCOUNTER — Emergency Department (HOSPITAL_COMMUNITY): Payer: Medicare Other

## 2011-09-11 DIAGNOSIS — I739 Peripheral vascular disease, unspecified: Secondary | ICD-10-CM

## 2011-09-11 DIAGNOSIS — Z8673 Personal history of transient ischemic attack (TIA), and cerebral infarction without residual deficits: Secondary | ICD-10-CM

## 2011-09-11 DIAGNOSIS — N183 Chronic kidney disease, stage 3 unspecified: Secondary | ICD-10-CM | POA: Diagnosis present

## 2011-09-11 DIAGNOSIS — R6251 Failure to thrive (child): Secondary | ICD-10-CM

## 2011-09-11 DIAGNOSIS — R262 Difficulty in walking, not elsewhere classified: Secondary | ICD-10-CM | POA: Diagnosis present

## 2011-09-11 DIAGNOSIS — I4891 Unspecified atrial fibrillation: Secondary | ICD-10-CM | POA: Diagnosis present

## 2011-09-11 DIAGNOSIS — S42209A Unspecified fracture of upper end of unspecified humerus, initial encounter for closed fracture: Principal | ICD-10-CM | POA: Diagnosis present

## 2011-09-11 DIAGNOSIS — R296 Repeated falls: Secondary | ICD-10-CM | POA: Diagnosis present

## 2011-09-11 DIAGNOSIS — H3530 Unspecified macular degeneration: Secondary | ICD-10-CM

## 2011-09-11 DIAGNOSIS — D696 Thrombocytopenia, unspecified: Secondary | ICD-10-CM | POA: Diagnosis present

## 2011-09-11 DIAGNOSIS — F0390 Unspecified dementia without behavioral disturbance: Secondary | ICD-10-CM | POA: Diagnosis present

## 2011-09-11 DIAGNOSIS — E785 Hyperlipidemia, unspecified: Secondary | ICD-10-CM

## 2011-09-11 DIAGNOSIS — Y998 Other external cause status: Secondary | ICD-10-CM

## 2011-09-11 DIAGNOSIS — R627 Adult failure to thrive: Secondary | ICD-10-CM | POA: Diagnosis present

## 2011-09-11 DIAGNOSIS — S32009A Unspecified fracture of unspecified lumbar vertebra, initial encounter for closed fracture: Secondary | ICD-10-CM | POA: Diagnosis present

## 2011-09-11 DIAGNOSIS — S301XXA Contusion of abdominal wall, initial encounter: Secondary | ICD-10-CM

## 2011-09-11 DIAGNOSIS — I129 Hypertensive chronic kidney disease with stage 1 through stage 4 chronic kidney disease, or unspecified chronic kidney disease: Secondary | ICD-10-CM | POA: Diagnosis present

## 2011-09-11 DIAGNOSIS — I10 Essential (primary) hypertension: Secondary | ICD-10-CM

## 2011-09-11 DIAGNOSIS — T1490XA Injury, unspecified, initial encounter: Secondary | ICD-10-CM | POA: Diagnosis present

## 2011-09-11 DIAGNOSIS — S42301A Unspecified fracture of shaft of humerus, right arm, initial encounter for closed fracture: Secondary | ICD-10-CM | POA: Insufficient documentation

## 2011-09-11 DIAGNOSIS — S329XXA Fracture of unspecified parts of lumbosacral spine and pelvis, initial encounter for closed fracture: Secondary | ICD-10-CM | POA: Diagnosis present

## 2011-09-11 DIAGNOSIS — S32050A Wedge compression fracture of fifth lumbar vertebra, initial encounter for closed fracture: Secondary | ICD-10-CM

## 2011-09-11 DIAGNOSIS — M810 Age-related osteoporosis without current pathological fracture: Secondary | ICD-10-CM

## 2011-09-11 DIAGNOSIS — D649 Anemia, unspecified: Secondary | ICD-10-CM | POA: Diagnosis present

## 2011-09-11 DIAGNOSIS — Z7901 Long term (current) use of anticoagulants: Secondary | ICD-10-CM

## 2011-09-11 DIAGNOSIS — Z9181 History of falling: Secondary | ICD-10-CM

## 2011-09-11 DIAGNOSIS — N189 Chronic kidney disease, unspecified: Secondary | ICD-10-CM

## 2011-09-11 HISTORY — DX: Reserved for concepts with insufficient information to code with codable children: IMO0002

## 2011-09-11 HISTORY — DX: Wedge compression fracture of fifth lumbar vertebra, initial encounter for closed fracture: S32.050A

## 2011-09-11 HISTORY — DX: Transient cerebral ischemic attack, unspecified: G45.9

## 2011-09-11 HISTORY — DX: Contusion of abdominal wall, initial encounter: S30.1XXA

## 2011-09-11 LAB — DIFFERENTIAL
Basophils Relative: 0 % (ref 0–1)
Eosinophils Absolute: 0 10*3/uL (ref 0.0–0.7)
Neutrophils Relative %: 89 % — ABNORMAL HIGH (ref 43–77)

## 2011-09-11 LAB — BASIC METABOLIC PANEL
BUN: 25 mg/dL — ABNORMAL HIGH (ref 6–23)
GFR calc Af Amer: 44 mL/min — ABNORMAL LOW (ref >90)
GFR calc non Af Amer: 38 mL/min — ABNORMAL LOW (ref >90)
Potassium: 4.3 mEq/L (ref 3.5–5.1)
Sodium: 139 mEq/L (ref 135–145)

## 2011-09-11 LAB — URINALYSIS, ROUTINE W REFLEX MICROSCOPIC
Bilirubin Urine: NEGATIVE
Nitrite: NEGATIVE
Protein, ur: NEGATIVE mg/dL
Specific Gravity, Urine: 1.015 (ref 1.005–1.030)
Urobilinogen, UA: 1 mg/dL (ref 0.0–1.0)

## 2011-09-11 LAB — CBC
MCH: 30.7 pg (ref 26.0–34.0)
MCHC: 33.3 g/dL (ref 30.0–36.0)
Platelets: 187 10*3/uL (ref 150–400)
RDW: 13.3 % (ref 11.5–15.5)

## 2011-09-11 MED ORDER — MORPHINE SULFATE 2 MG/ML IJ SOLN
2.0000 mg | Freq: Once | INTRAMUSCULAR | Status: AC
  Administered 2011-09-11: 2 mg via INTRAVENOUS
  Filled 2011-09-11: qty 1

## 2011-09-11 MED ORDER — SODIUM CHLORIDE 0.9 % IV SOLN
INTRAVENOUS | Status: DC
  Administered 2011-09-11: 1000 mL via INTRAVENOUS

## 2011-09-11 NOTE — ED Provider Notes (Signed)
History   This chart was scribed for Donnetta Hutching, MD by Sofie Rower. The patient was seen in room [facility]14/[facility]14 and the patient's care was started at 8:47 PM     CSN: [id number]  Arrival date & time 09/11/11  1918   First MD Initiated Contact with Patient 09/11/11 2020      Chief Complaint  Patient presents with  . Fall  . Shoulder Injury    (Consider location/radiation/quality/duration/timing/severity/associated sxs/prior treatment) HPI  [name] is a 78 y.o. female who presents to the Emergency Department complaining of moderate, episodic shoulder injury located on the right shoulder onset today with associated symptoms of pain in both legs, trembling, back pain. The pt relative states "she was standing in the kitchen, fell, and landed on her right side." The pt daughter informs the EDP "the pt hs been falling a lot within the past two weeks, last week she fell twice." Pt son informs the EDP "the patient was ambulatory after the fall." The pt family members report "pt instability has been unusually worse within the last two weeks." Modifying factors include application of ice to the right shoulder which provides moderate relief, walking with a walker which provides moderate relief from falling. Pt has a hx of visit with Dr. Dietrich Pates on Friday, 09/06/11, where which the family reports "the MD was concerned with the pt intermittent episodes of atrial fibrillation."   Pt denies hitting her head.   Pt daughter reports pt took coumadin before the fall episode. Level V caveat for urgent need for intervention  PCP is Dr. Phillips Odor. Cardiologist is Dr. Dietrich Pates.   Past Medical History  Diagnosis Date  . Hypertension     Lab 04/2011: Normal CMet except BUN of 29 and creatinine of 1.36  . Hyperlipidemia     Lipid profile in 05/2009:163, 78, 58, 89; 09/2010:179, 101, 61, 98  . Atrial fibrillation      thromboembolic CVA (right cerebral) in 10/2005  . Chronic anticoagulation      nl CBC in  04/2011  . PVD (peripheral vascular disease)     -no claudication; distal pulses are decreased  . Kyphosis   . Chronic kidney disease     -exacerbated by diuresis; creatinine of 1.8 in 6/98, 1.7 in 11/2006 and 1.36 in 3/09 & 05/2008, 1.38 in 05/2009; 1.36 in 04/2011  . Weight loss   . Hyperkalemia     5.7 in 3/09; 4.5 and 1/10  . Dyspnea   . Macular degeneration   . Osteoporosis     vertebral fracture    Past Surgical History  Procedure Date  . Cholecystectomy 1975  . Appendectomy     Family History  Problem Relation Age of Onset  . Heart disease Brother   . Heart disease Brother   . Heart disease Sister   . Heart disease Sister   . Heart attack Sister     History  Substance Use Topics  . Smoking status: Never Smoker   . Smokeless tobacco: Current User    Types: Snuff  . Alcohol Use: No    OB History    Grav Para Term Preterm Abortions TAB SAB Ect Mult Living                  Review of Systems  Unable to perform ROS: Other  All other systems reviewed and are negative.    10 Systems reviewed and all are negative for acute change except as noted in the HPI.  Allergies  Review of patient's allergies indicates no known allergies.  Home Medications   Current Outpatient Rx  Name Route Sig Dispense Refill  . ACETAMINOPHEN ER 650 MG PO TBCR Oral Take 650 mg by mouth as needed.      . ALENDRONATE SODIUM 70 MG PO TABS Oral Take 70 mg by mouth every 7 (seven) days. Take in the morning with a full glass of water, on an empty stomach, and do not take anything else by mouth or lie down for the next 30 min.     Marland Kitchen BIMATOPROST 0.03 % OP SOLN Both Eyes Place 1 drop into both eyes at bedtime.      Marland Kitchen BISACODYL 5 MG PO TBEC Oral Take 5 mg by mouth as needed.      Marland Kitchen CETIRIZINE HCL 10 MG PO TABS Oral Take 10 mg by mouth daily.      Marland Kitchen CITALOPRAM HYDROBROMIDE 20 MG PO TABS Oral Take 20 mg by mouth daily.      Marland Kitchen COENZYME Q-10 PO Oral Take 10 mg by mouth daily.      Marland Kitchen DIAZEPAM  2 MG PO TABS Oral Take 2 mg by mouth 2 (two) times daily.      Marland Kitchen DIPHENHYDRAMINE HCL 25 MG PO CAPS Oral Take 25 mg by mouth as needed.      Marland Kitchen DM-APAP-CPM 15-500-2 MG PO TABS Oral Take 2 mg by mouth as needed.      Marland Kitchen DOCUSATE SODIUM 100 MG PO CAPS Oral Take 100 mg by mouth 3 (three) times daily.      . DONEPEZIL HCL 10 MG PO TABS Oral Take 10 mg by mouth daily.      . FUROSEMIDE 40 MG PO TABS Oral Take 40 mg by mouth daily.      Marland Kitchen LORATADINE 10 MG PO TABS Oral Take 10 mg by mouth daily.      Marland Kitchen MEMANTINE HCL 5 MG PO TABS Oral Take 5 mg by mouth 2 (two) times daily.    Marland Kitchen METOPROLOL SUCCINATE ER 25 MG PO TB24 Oral Take 25 mg by mouth daily.      Marland Kitchen ONE-DAILY MULTI VITAMINS PO TABS Oral Take 1 tablet by mouth daily.      Marland Kitchen NAPROXEN SODIUM 220 MG PO TABS Oral Take 220 mg by mouth as needed.      Marland Kitchen ROPINIROLE HCL 1 MG PO TABS Oral Take 1 mg by mouth at bedtime.      . WARFARIN SODIUM 2 MG PO TABS  Take 3mg  (1 &1/2 tabs) on Wednesday and 4mg  (2 tabs) all other days 54 tablet 2    BP 144/111  Pulse 82  Temp(Src) 97.6 F (36.4 C) (Oral)  Resp 17  Ht 4\' 8"  (1.422 m)  Wt 105 lb (47.628 kg)  BMI 23.54 kg/m2  SpO2 98%  Physical Exam  Nursing note and vitals reviewed. Constitutional: She appears well-developed and well-nourished.  HENT:  Head: Atraumatic.  Right Ear: External ear normal.  Left Ear: External ear normal.  Nose: Nose normal.  Eyes: Right eye exhibits no discharge. Left eye exhibits no discharge.  Neck: Normal range of motion. Neck supple. No tracheal deviation present.  Pulmonary/Chest: Effort normal.  Abdominal: Soft.  Musculoskeletal: She exhibits tenderness (Proximal, lateral, right humerous. Right and left hip bilaterally, worse on the left hip.  Peri right wrist, generalized posterior right forearm tenderness. ).  Neurological: She is alert.  Skin: Skin is warm.  Psychiatric: She has a normal  mood and affect. Her behavior is normal.    ED Course  Procedures (including  critical care time)  DIAGNOSTIC STUDIES: Oxygen Saturation is 98% on room air, normal by my interpretation.    COORDINATION OF CARE:  Dg Chest 1 View  09/11/2011  *RADIOLOGY REPORT*  Clinical Data: Pain after fall; right humerus fracture  CHEST - 1 VIEW  Comparison: June 07, 2010  Findings: Cardiomegaly is unchanged.  The mediastinum and pulmonary vasculature are within normal limits.  There is no evidence of gross rib fracture or pneumothorax.  No infiltrates or effusions are identified.  IMPRESSION: Cardiomegaly, unchanged.  Original Report Authenticated By: Brandon Melnick, M.D.   Dg Pelvis 1-2 Views  09/11/2011  *RADIOLOGY REPORT*  Clinical Data: Pain after fall  PELVIS - 1-2 VIEW  Comparison: None.  Findings:  There is no evidence of pelvic fracture or dislocation. The bones are osteopenic.  Vascular calcifications are present. There are degenerative changes at the lower lumbar spine.  IMPRESSION: No evidence of pelvic fracture or dislocation.  Original Report Authenticated By: Brandon Melnick, M.D.   Dg Shoulder Right  09/11/2011  *RADIOLOGY REPORT*  Clinical Data: Fall.  Shoulder pain.  RIGHT SHOULDER - 2+ VIEW  Comparison: None.  Findings: The patient has an impacted surgical neck fracture of the right humerus with extension into the greater tuberosity. Acromioclavicular joint is intact with degenerative change noted. Imaged right lung and ribs appear normal.  IMPRESSION: Impacted surgical neck fracture of the right humerus involves the greater tuberosity.  Original Report Authenticated By: Bernadene Bell. D'ALESSIO, M.D.   Dg Forearm Right  09/11/2011  *RADIOLOGY REPORT*  Clinical Data: Pain after fall  RIGHT FOREARM - 2 VIEW  Comparison:  None  Findings: There is no evidence of fracture or dislocation.  The bones are demineralized.  There is evidence of osteoarthritis the first CMC joint.  Vascular calcifications are present.  IMPRESSION:  .No evidence of fracture or dislocation.  Original  Report Authenticated By: Brandon Melnick, M.D.   Dg Humerus Right  09/11/2011  *RADIOLOGY REPORT*  Clinical Data: Pain after fall  RIGHT HUMERUS - 2+ VIEW  Comparison: None.  Findings: There is a comminuted fracture of the proximal humerus which is best seen on the shoulder films.  IMPRESSION: Comminuted fracture of the proximal right humerus.  Original Report Authenticated By: Brandon Melnick, M.D.       Date: 09/11/2011  Rate: 72  Rhythm: atrial fibrillation  QRS Axis: normal  Intervals: normal  ST/T Wave abnormalities: normal  Conduction Disutrbances:none  Narrative Interpretation:   Old EKG Reviewed: changes noted TWI laterally  No diagnosis found.  8:56PM- EDP at bedside discusses treatment plan concerning blood work and x-rays.   MDM  Fracture of proximal right humerus noted.patient unable to care for self. Admit      I personally performed the services described in this documentation, which was scribed in my presence. The recorded information has been reviewed and considered.    Donnetta Hutching, MD 09/12/11 [phone]

## 2011-09-11 NOTE — ED Notes (Signed)
Pt states was putting water away & fell when she turned back to her walker. Pt landed on right arm/shoulder. Pt denies LOC or hitting her head.

## 2011-09-11 NOTE — ED Notes (Signed)
Pt reports she standing in the kitchen and fell landing on rt side, pt reports she does not know why she fell, pt now c/o of increased pain to rt shoulder, family reports pt saw Dr. Dietrich Pates on Friday and MD was concerned about her intermittent episodes of uncontrolled atrial fib

## 2011-09-11 NOTE — ED Notes (Signed)
Per pt's son (stays with him during the week), the pt was getting water from the refrigerator and turned around away from her walker and lost her balance, fell on rt side/arm/shoulder. Pt has a skin tear (bleeding controlled and wrapped) on rt arm. Pt did not have LOC, denies hitting head. Pain localized in rt arm/shoulder. Pt does have a hx of afib and takes coumadin.

## 2011-09-12 ENCOUNTER — Encounter (HOSPITAL_COMMUNITY): Payer: Self-pay | Admitting: Internal Medicine

## 2011-09-12 ENCOUNTER — Inpatient Hospital Stay (HOSPITAL_COMMUNITY): Payer: Medicare Other

## 2011-09-12 ENCOUNTER — Emergency Department (HOSPITAL_COMMUNITY): Payer: Medicare Other

## 2011-09-12 DIAGNOSIS — I4891 Unspecified atrial fibrillation: Secondary | ICD-10-CM

## 2011-09-12 DIAGNOSIS — N189 Chronic kidney disease, unspecified: Secondary | ICD-10-CM

## 2011-09-12 DIAGNOSIS — S42301A Unspecified fracture of shaft of humerus, right arm, initial encounter for closed fracture: Secondary | ICD-10-CM | POA: Insufficient documentation

## 2011-09-12 DIAGNOSIS — R6251 Failure to thrive (child): Secondary | ICD-10-CM | POA: Insufficient documentation

## 2011-09-12 DIAGNOSIS — F0390 Unspecified dementia without behavioral disturbance: Secondary | ICD-10-CM | POA: Diagnosis present

## 2011-09-12 DIAGNOSIS — Z7901 Long term (current) use of anticoagulants: Secondary | ICD-10-CM

## 2011-09-12 LAB — COMPREHENSIVE METABOLIC PANEL
ALT: 20 U/L (ref 0–35)
AST: 30 U/L (ref 0–37)
Alkaline Phosphatase: 62 U/L (ref 39–117)
CO2: 25 mEq/L (ref 19–32)
Chloride: 104 mEq/L (ref 96–112)
Creatinine, Ser: 1.21 mg/dL — ABNORMAL HIGH (ref 0.50–1.10)
GFR calc non Af Amer: 40 mL/min — ABNORMAL LOW (ref >90)
Total Bilirubin: 0.5 mg/dL (ref 0.3–1.2)

## 2011-09-12 LAB — CBC
MCV: 92.8 fL (ref 78.0–100.0)
Platelets: 179 10*3/uL (ref 150–400)
RBC: 4.04 MIL/uL (ref 3.87–5.11)
WBC: 8.5 10*3/uL (ref 4.0–10.5)

## 2011-09-12 LAB — PROTIME-INR
INR: 2.09 — ABNORMAL HIGH (ref 0.00–1.49)
INR: 2.22 — ABNORMAL HIGH (ref 0.00–1.49)
Prothrombin Time: 23.8 seconds — ABNORMAL HIGH (ref 11.6–15.2)

## 2011-09-12 LAB — MRSA PCR SCREENING: MRSA by PCR: NEGATIVE

## 2011-09-12 MED ORDER — DIAZEPAM 2 MG PO TABS: 2.0000 mg | ORAL_TABLET | Freq: Every day | ORAL | Status: DC

## 2011-09-12 MED ORDER — DILTIAZEM HCL 30 MG PO TABS
30.0000 mg | ORAL_TABLET | Freq: Three times a day (TID) | ORAL | Status: DC
  Administered 2011-09-12 – 2011-09-15 (×9): 30 mg via ORAL
  Filled 2011-09-12 (×9): qty 1

## 2011-09-12 MED ORDER — DIAZEPAM 2 MG PO TABS
2.0000 mg | ORAL_TABLET | Freq: Every day | ORAL | Status: DC
  Administered 2011-09-12 – 2011-09-16 (×5): 2 mg via ORAL
  Filled 2011-09-12 (×5): qty 1

## 2011-09-12 MED ORDER — MEMANTINE HCL 10 MG PO TABS
5.0000 mg | ORAL_TABLET | Freq: Two times a day (BID) | ORAL | Status: DC
  Administered 2011-09-12 – 2011-09-17 (×11): 5 mg via ORAL
  Filled 2011-09-12: qty 2
  Filled 2011-09-12 (×10): qty 1

## 2011-09-12 MED ORDER — TRAZODONE HCL 50 MG PO TABS: 25.0000 mg | ORAL_TABLET | Freq: Every evening | ORAL | Status: DC | PRN

## 2011-09-12 MED ORDER — LORATADINE 10 MG PO TABS
10.0000 mg | ORAL_TABLET | Freq: Every day | ORAL | Status: DC
  Administered 2011-09-12 – 2011-09-17 (×6): 10 mg via ORAL
  Filled 2011-09-12 (×7): qty 1

## 2011-09-12 MED ORDER — DOCUSATE SODIUM 100 MG PO CAPS
100.0000 mg | ORAL_CAPSULE | Freq: Three times a day (TID) | ORAL | Status: DC
  Administered 2011-09-12 – 2011-09-17 (×16): 100 mg via ORAL
  Filled 2011-09-12 (×16): qty 1

## 2011-09-12 MED ORDER — CITALOPRAM HYDROBROMIDE 20 MG PO TABS
20.0000 mg | ORAL_TABLET | Freq: Every day | ORAL | Status: DC
  Administered 2011-09-12 – 2011-09-17 (×6): 20 mg via ORAL
  Filled 2011-09-12 (×6): qty 1

## 2011-09-12 MED ORDER — ACETAMINOPHEN 325 MG PO TABS
650.0000 mg | ORAL_TABLET | Freq: Four times a day (QID) | ORAL | Status: DC | PRN
  Filled 2011-09-12: qty 2

## 2011-09-12 MED ORDER — IBUPROFEN 400 MG PO TABS: 400.0000 mg | ORAL_TABLET | Freq: Four times a day (QID) | ORAL | Status: DC | PRN

## 2011-09-12 MED ORDER — POTASSIUM CHLORIDE IN NACL 20-0.9 MEQ/L-% IV SOLN
INTRAVENOUS | Status: DC
  Administered 2011-09-12: 17:00:00 via INTRAVENOUS
  Administered 2011-09-12: 1000 mL via INTRAVENOUS
  Administered 2011-09-12: 11:00:00 via INTRAVENOUS

## 2011-09-12 MED ORDER — WARFARIN - PHARMACIST DOSING INPATIENT
Freq: Every day | Status: DC
  Administered 2011-09-14: 18:00:00

## 2011-09-12 MED ORDER — ONDANSETRON HCL 4 MG/2ML IJ SOLN: 4.0000 mg | Freq: Four times a day (QID) | INTRAMUSCULAR | Status: DC | PRN

## 2011-09-12 MED ORDER — FLEET ENEMA 7-19 GM/118ML RE ENEM: 1.0000 | ENEMA | Freq: Once | RECTAL | Status: AC | PRN

## 2011-09-12 MED ORDER — DILTIAZEM HCL 100 MG IV SOLR
5.0000 mg/h | INTRAVENOUS | Status: DC
  Administered 2011-09-12: 5 mg/h via INTRAVENOUS
  Filled 2011-09-12: qty 100

## 2011-09-12 MED ORDER — ONDANSETRON HCL 4 MG PO TABS: 4.0000 mg | ORAL_TABLET | Freq: Four times a day (QID) | ORAL | Status: DC | PRN

## 2011-09-12 MED ORDER — ROPINIROLE HCL 1 MG PO TABS
1.0000 mg | ORAL_TABLET | Freq: Every day | ORAL | Status: DC
  Administered 2011-09-13 – 2011-09-16 (×3): 1 mg via ORAL
  Filled 2011-09-12 (×6): qty 1

## 2011-09-12 MED ORDER — ACETAMINOPHEN 650 MG RE SUPP: 650.0000 mg | Freq: Four times a day (QID) | RECTAL | Status: DC | PRN

## 2011-09-12 MED ORDER — BIMATOPROST 0.03 % OP SOLN
1.0000 [drp] | Freq: Every day | OPHTHALMIC | Status: DC
  Administered 2011-09-13 – 2011-09-14 (×2): 1 [drp] via OPHTHALMIC
  Filled 2011-09-12: qty 2.5

## 2011-09-12 MED ORDER — OXYCODONE HCL 5 MG PO TABS
5.0000 mg | ORAL_TABLET | ORAL | Status: DC | PRN
  Administered 2011-09-12 – 2011-09-17 (×15): 5 mg via ORAL
  Filled 2011-09-12 (×15): qty 1

## 2011-09-12 MED ORDER — WARFARIN SODIUM 2 MG PO TABS
2.0000 mg | ORAL_TABLET | Freq: Once | ORAL | Status: AC
  Administered 2011-09-12: 2 mg via ORAL
  Filled 2011-09-12: qty 1

## 2011-09-12 MED ORDER — DONEPEZIL HCL 5 MG PO TABS
5.0000 mg | ORAL_TABLET | Freq: Every day | ORAL | Status: DC
  Administered 2011-09-12 – 2011-09-16 (×5): 5 mg via ORAL
  Filled 2011-09-12 (×5): qty 1

## 2011-09-12 MED ORDER — BISACODYL 10 MG RE SUPP: 10.0000 mg | Freq: Every day | RECTAL | Status: DC | PRN

## 2011-09-12 NOTE — Plan of Care (Signed)
Problem: Phase I Progression Outcomes Goal: Voiding-avoid urinary catheter unless indicated Outcome: Completed/Met Date Met:  09/12/11 No catheter at this time

## 2011-09-12 NOTE — H&P (Signed)
PCP:   Colette Ribas, MD, MD   Chief Complaint:  Difficulty walking    HPI: [name] is an 78 y.o. female.  Dementia; chronic atrial fibrillation; has 24 hour home care her between her son and daughter; has been having increasingly frequent falls, fell today and injured her right arm and her hip, and now has difficulty walking. Was brought to the emergency room to be evaluated found to have a fracture of the proximal right humerus, and unable to get up to walk, hospitalist service was called to assist with management.  Patient's heart rate was noted to vary between 90 and 150 during the hospitalist interview.  There is no history of nausea or vomiting no bloody or black stool; no frank syncope.  Rewiew of Systems:  The patient denies anorexia, fever, weight loss,, vision loss, decreased hearing, hoarseness, chest pain, syncope, dyspnea on exertion, peripheral edema, balance deficits, hemoptysis, abdominal pain, melena, hematochezia, severe indigestion/heartburn, hematuria, incontinence, genital sores, muscle weakness, suspicious skin lesions, transient blindness,, depression, unusual weight change, abnormal bleeding, enlarged lymph nodes, angioedema, and breast masses.    Past Medical History  Diagnosis Date  . Hypertension     Lab 04/2011: Normal CMet except BUN of 29 and creatinine of 1.36  . Hyperlipidemia     Lipid profile in 05/2009:163, 78, 58, 89; 09/2010:179, 101, 61, 98  . Atrial fibrillation      thromboembolic CVA (right cerebral) in 10/2005  . Chronic anticoagulation      nl CBC in 04/2011  . PVD (peripheral vascular disease)     -no claudication; distal pulses are decreased  . Kyphosis   . Chronic kidney disease     -exacerbated by diuresis; creatinine of 1.8 in 6/98, 1.7 in 11/2006 and 1.36 in 3/09 & 05/2008, 1.38 in 05/2009; 1.36 in 04/2011  . Weight loss   . Hyperkalemia     5.7 in 3/09; 4.5 and 1/10  . Dyspnea   . Macular degeneration   . Osteoporosis      vertebral fracture  . TIA (transient ischemic attack)     2007    Past Surgical History  Procedure Date  . Cholecystectomy 1975  . Appendectomy     Medications:  HOME MEDS: Prior to Admission medications   Medication Sig Start Date End Date Taking? Authorizing Provider  alendronate (FOSAMAX) 70 MG tablet Take 70 mg by mouth every 7 (seven) days. Take in the morning with a full glass of water, on an empty stomach, and do not take anything else by mouth or lie down for the next 30 min.    Yes Historical Provider, MD  bimatoprost (LUMIGAN) 0.03 % ophthalmic drops Place 1 drop into both eyes at bedtime.     Yes Historical Provider, MD  cetirizine (ZYRTEC) 10 MG tablet Take 10 mg by mouth at bedtime.    Yes Historical Provider, MD  citalopram (CELEXA) 20 MG tablet Take 20 mg by mouth every morning.    Yes Historical Provider, MD  COENZYME Q-10 PO Take 10 mg by mouth daily.     Yes Historical Provider, MD  diazepam (VALIUM) 2 MG tablet Take 2-4 mg by mouth at bedtime.    Yes Historical Provider, MD  docusate sodium (COLACE) 100 MG capsule Take 100 mg by mouth 3 (three) times daily.     Yes Historical Provider, MD  donepezil (ARICEPT) 5 MG tablet Take 5 mg by mouth at bedtime.   Yes Historical Provider, MD  furosemide (LASIX)  40 MG tablet Take 40 mg by mouth every morning.    Yes Historical Provider, MD  ibuprofen (ADVIL,MOTRIN) 600 MG tablet Take 600 mg by mouth as needed. For pain   Yes Historical Provider, MD  memantine (NAMENDA) 5 MG tablet Take 5 mg by mouth 2 (two) times daily. At 10am and 5 pm   Yes Historical Provider, MD  Multiple Vitamins-Minerals (ADULT ONE DAILY GUMMIES) CHEW Chew 0.5 tablets by mouth daily.   Yes Historical Provider, MD  rOPINIRole (REQUIP) 1 MG tablet Take 1 mg by mouth at bedtime.     Yes Historical Provider, MD  warfarin (COUMADIN) 2 MG tablet Take 3-4 mg by mouth See admin instructions. Take 3mg  (1 &1/2 tabs) on Tuesdays through Sunday. Take two tablets  (4mg ) on Monday only 04/22/11  Yes Kathlen Brunswick, MD     Allergies:  No Known Allergies  Social History:   reports that she has never smoked. Her smokeless tobacco use includes Snuff. She reports that she does not drink alcohol or use illicit drugs.  Family History: Family History  Problem Relation Age of Onset  . Heart disease Brother   . Heart disease Brother   . Heart disease Sister   . Heart disease Sister   . Heart attack Sister      Physical Exam: Filed Vitals:   09/12/11 0046 09/12/11 0300 09/12/11 0348 09/12/11 0349  BP: 151/87   157/112  Pulse: 106 60  126  Temp:   98.1 F (36.7 C) 98.1 F (36.7 C)  TempSrc:   Oral Oral  Resp: 17 23  24   Height:   4\' 8"  (1.422 m) 4\' 8"  (1.422 m)  Weight:   51.1 kg (112 lb 10.5 oz) 51.1 kg (112 lb 10.5 oz)  SpO2: 99% 97%  96%   Blood pressure 157/112, pulse 126, temperature 98.1 F (36.7 C), temperature source Oral, resp. rate 24, height 4\' 8"  (1.422 m), weight 51.1 kg (112 lb 10.5 oz), SpO2 96.00%.  GEN:  Pleasant elderly Caucasian lady lying in the stretcher in no acute distress; cooperative with exam PSYCH:  alert and oriented x1; does not appear anxious or depressed; affect is appropriate. HEENT: Mucous membranes pink and anicteric; PERRLA; EOM intact; no cervical lymphadenopathy nor thyromegaly or carotid bruit; no JVD; Breasts:: Not examined CHEST WALL: No tenderness CHEST: Normal respiration, clear to auscultation bilaterally HEART: Irregularly irregular rhythm; no murmurs rubs or gallops BACK: Mild kyphosis or scoliosis; no CVA tenderness ABDOMEN: Scaphoid, soft non-tender; no masses, no organomegaly, normal abdominal bowel sounds; no pannus; no intertriginous candida. Rectal Exam: Not done EXTREMITIES: Right upper extremity held rigidly on her right side not moving; pelvis tender to pressure; flexion at the hips and flexion of knees limited by pain; no bruising noted no edema; no ulcerations. Genitalia: not  examined PULSES: 2+ and symmetric SKIN: Normal hydration no rash or ulceration CNS: Cranial nerves 2-12 grossly unable to fully examine due to limitations of pain   Labs & Imaging Results for orders placed during the hospital encounter of 09/11/11 (from the past 48 hour(s))  CBC     Status: Abnormal   Collection Time   09/11/11  9:19 PM      Component Value Range Comment   WBC 10.7 (*) 4.0 - 10.5 (K/uL)    RBC 4.33  3.87 - 5.11 (MIL/uL)    Hemoglobin 13.3  12.0 - 15.0 (g/dL)    HCT 16.1  09.6 - 04.5 (%)    MCV 92.4  78.0 - 100.0 (fL)    MCH 30.7  26.0 - 34.0 (pg)    MCHC 33.3  30.0 - 36.0 (g/dL)    RDW 16.1  09.6 - 04.5 (%)    Platelets 187  150 - 400 (K/uL)   DIFFERENTIAL     Status: Abnormal   Collection Time   09/11/11  9:19 PM      Component Value Range Comment   Neutrophils Relative 89 (*) 43 - 77 (%)    Neutro Abs 9.5 (*) 1.7 - 7.7 (K/uL)    Lymphocytes Relative 7 (*) 12 - 46 (%)    Lymphs Abs 0.8  0.7 - 4.0 (K/uL)    Monocytes Relative 4  3 - 12 (%)    Monocytes Absolute 0.5  0.1 - 1.0 (K/uL)    Eosinophils Relative 0  0 - 5 (%)    Eosinophils Absolute 0.0  0.0 - 0.7 (K/uL)    Basophils Relative 0  0 - 1 (%)    Basophils Absolute 0.0  0.0 - 0.1 (K/uL)   BASIC METABOLIC PANEL     Status: Abnormal   Collection Time   09/11/11  9:19 PM      Component Value Range Comment   Sodium 139  135 - 145 (mEq/L)    Potassium 4.3  3.5 - 5.1 (mEq/L)    Chloride 103  96 - 112 (mEq/L)    CO2 25  19 - 32 (mEq/L)    Glucose, Bld 161 (*) 70 - 99 (mg/dL)    BUN 25 (*) 6 - 23 (mg/dL)    Creatinine, Ser 4.09 (*) 0.50 - 1.10 (mg/dL)    Calcium 9.2  8.4 - 10.5 (mg/dL)    GFR calc non Af Amer 38 (*) >90 (mL/min)    GFR calc Af Amer 44 (*) >90 (mL/min)   URINALYSIS, ROUTINE W REFLEX MICROSCOPIC     Status: Abnormal   Collection Time   09/11/11 10:14 PM      Component Value Range Comment   Color, Urine YELLOW  YELLOW     APPearance CLEAR  CLEAR     Specific Gravity, Urine 1.015  1.005 -  1.030     pH 6.5  5.0 - 8.0     Glucose, UA NEGATIVE  NEGATIVE (mg/dL)    Hgb urine dipstick TRACE (*) NEGATIVE     Bilirubin Urine NEGATIVE  NEGATIVE     Ketones, ur NEGATIVE  NEGATIVE (mg/dL)    Protein, ur NEGATIVE  NEGATIVE (mg/dL)    Urobilinogen, UA 1.0  0.0 - 1.0 (mg/dL)    Nitrite NEGATIVE  NEGATIVE     Leukocytes, UA NEGATIVE  NEGATIVE    URINE MICROSCOPIC-ADD ON     Status: Abnormal   Collection Time   09/11/11 10:14 PM      Component Value Range Comment   Squamous Epithelial / LPF RARE  RARE     WBC, UA 0-2  <3 (WBC/hpf)    RBC / HPF 3-6  <3 (RBC/hpf)    Bacteria, UA FEW (*) RARE    PROTIME-INR     Status: Abnormal   Collection Time   09/12/11 12:12 AM      Component Value Range Comment   Prothrombin Time 23.8 (*) 11.6 - 15.2 (seconds)    INR 2.09 (*) 0.00 - 1.49     Dg Chest 1 View  09/11/2011  *RADIOLOGY REPORT*  Clinical Data: Pain after fall; right humerus fracture  CHEST - 1 VIEW  Comparison: June 07, 2010  Findings: Cardiomegaly is unchanged.  The mediastinum and pulmonary vasculature are within normal limits.  There is no evidence of gross rib fracture or pneumothorax.  No infiltrates or effusions are identified.  IMPRESSION: Cardiomegaly, unchanged.  Original Report Authenticated By: Brandon Melnick, M.D.   Dg Pelvis 1-2 Views  09/11/2011  *RADIOLOGY REPORT*  Clinical Data: Pain after fall  PELVIS - 1-2 VIEW  Comparison: None.  Findings:  There is no evidence of pelvic fracture or dislocation. The bones are osteopenic.  Vascular calcifications are present. There are degenerative changes at the lower lumbar spine.  IMPRESSION: No evidence of pelvic fracture or dislocation.  Original Report Authenticated By: Brandon Melnick, M.D.   Dg Shoulder Right  09/11/2011  *RADIOLOGY REPORT*  Clinical Data: Fall.  Shoulder pain.  RIGHT SHOULDER - 2+ VIEW  Comparison: None.  Findings: The patient has an impacted surgical neck fracture of the right humerus with extension into the  greater tuberosity. Acromioclavicular joint is intact with degenerative change noted. Imaged right lung and ribs appear normal.  IMPRESSION: Impacted surgical neck fracture of the right humerus involves the greater tuberosity.  Original Report Authenticated By: Bernadene Bell. D'ALESSIO, M.D.   Dg Forearm Right  09/11/2011  *RADIOLOGY REPORT*  Clinical Data: Pain after fall  RIGHT FOREARM - 2 VIEW  Comparison:  None  Findings: There is no evidence of fracture or dislocation.  The bones are demineralized.  There is evidence of osteoarthritis the first CMC joint.  Vascular calcifications are present.  IMPRESSION:  .No evidence of fracture or dislocation.  Original Report Authenticated By: Brandon Melnick, M.D.   Ct Head Wo Contrast  09/12/2011  *RADIOLOGY REPORT*  Clinical Data: Fall.  History of mini strokes.  CT HEAD WITHOUT CONTRAST  Technique:  Contiguous axial images were obtained from the base of the skull through the vertex without contrast.  Comparison: Head CT 11/17/2005  Findings: There is moderate periventricular patchy white matter hypoattenuation suggesting chronic microvascular ischemic changes. The ventricles are normal in size.  Slight age-related cerebral atrophy.  Negative for hemorrhage, mass effect, mass lesion, or evidence of acute cortically based infarction. The visualized paranasal sinuses, mastoid air cells, and middle ears are clear. Thickened walls of the maxillary sinuses bilaterally, chronic, and may be due to prior infection.  IMPRESSION:  1.  No acute intracranial abnormality. 2.  Chronic extensive microvascular ischemic changes.  Original Report Authenticated By: Britta Mccreedy, M.D.   Dg Humerus Right  09/11/2011  *RADIOLOGY REPORT*  Clinical Data: Pain after fall  RIGHT HUMERUS - 2+ VIEW  Comparison: None.  Findings: There is a comminuted fracture of the proximal humerus which is best seen on the shoulder films.  IMPRESSION: Comminuted fracture of the proximal right humerus.  Original  Report Authenticated By: Brandon Melnick, M.D.      Assessment Present on Admission:  .Failure to thrive, recurrent falls .Atrial fibrillation, with possible sick sinus syndrome   Gait abnormality  .Fracture of right humerus .Chronic kidney disease .Hyperlipidemia .Hypertension Dementia   PLAN: Admit this lady to l telemetry /step down unit for management of heart rate; CT scan of the pelvis to rule out occult fracture; Consult her cardiologist to assess for sick sinus syndrome, and to consider discontinuing warfarin in light of recurrent falls. Discontinue NSAID in light of chronic kidney disease Continue management of other chronic medical problems  Other plans as per orders.   Adonia Porada 09/12/2011, 4:01 AM

## 2011-09-12 NOTE — Progress Notes (Signed)
We have received a request for PT eval, but CT scan is pending on pelvis.  We will hold orders until fxs have been ruled out.

## 2011-09-12 NOTE — Consult Note (Signed)
ANTICOAGULATION CONSULT NOTE - Initial Consult  Pharmacy Consult for Warfarin Indication: atrial fibrillation  No Known Allergies  Patient Measurements: Height: 4\' 8"  (142.2 cm) Weight: 112 lb 10.5 oz (51.1 kg) IBW/kg (Calculated) : 36.3   Vital Signs: Temp: 98.1 F (36.7 C) (04/18 0349) Temp src: Oral (04/18 0349) BP: 134/63 mmHg (04/18 0800) Pulse Rate: 77  (04/18 0800)  Labs:  Basename 09/12/11 0426 09/12/11 0012 09/11/11 2119 09/09/11 1200  HGB 12.4 -- 13.3 --  HCT 37.5 -- 40.0 --  PLT 179 -- 187 --  APTT -- -- -- --  LABPROT 25.0* 23.8* -- --  INR 2.22* 2.09* -- --  HEPARINUNFRC -- -- -- --  CREATININE 1.21* -- 1.26* 1.27*  CKTOTAL -- -- -- --  CKMB -- -- -- --  TROPONINI -- -- -- --   Estimated Creatinine Clearance: 23.5 ml/min (by C-G formula based on Cr of 1.21).  Medical History: Past Medical History  Diagnosis Date  . Hypertension     Lab 04/2011: Normal CMet except BUN of 29 and creatinine of 1.36  . Hyperlipidemia     Lipid profile in 05/2009:163, 78, 58, 89; 09/2010:179, 101, 61, 98  . Atrial fibrillation      thromboembolic CVA (right cerebral) in 10/2005  . Chronic anticoagulation      nl CBC in 04/2011  . PVD (peripheral vascular disease)     -no claudication; distal pulses are decreased  . Kyphosis   . Chronic kidney disease     -exacerbated by diuresis; creatinine of 1.8 in 6/98, 1.7 in 11/2006 and 1.36 in 3/09 & 05/2008, 1.38 in 05/2009; 1.36 in 04/2011  . Weight loss   . Hyperkalemia     5.7 in 3/09; 4.5 and 1/10  . Dyspnea   . Macular degeneration   . Osteoporosis     vertebral fracture  . TIA (transient ischemic attack)     2007   Medications:  Prescriptions prior to admission  Medication Sig Dispense Refill  . alendronate (FOSAMAX) 70 MG tablet Take 70 mg by mouth every 7 (seven) days. Take in the morning with a full glass of water, on an empty stomach, and do not take anything else by mouth or lie down for the next 30 min.       .  bimatoprost (LUMIGAN) 0.03 % ophthalmic drops Place 1 drop into both eyes at bedtime.        . cetirizine (ZYRTEC) 10 MG tablet Take 10 mg by mouth at bedtime.       . citalopram (CELEXA) 20 MG tablet Take 20 mg by mouth every morning.       Marland Kitchen COENZYME Q-10 PO Take 10 mg by mouth daily.        . diazepam (VALIUM) 2 MG tablet Take 2-4 mg by mouth at bedtime.       . docusate sodium (COLACE) 100 MG capsule Take 100 mg by mouth 3 (three) times daily.        Marland Kitchen donepezil (ARICEPT) 5 MG tablet Take 5 mg by mouth at bedtime.      . furosemide (LASIX) 40 MG tablet Take 40 mg by mouth every morning.       Marland Kitchen ibuprofen (ADVIL,MOTRIN) 600 MG tablet Take 600 mg by mouth as needed. For pain      . memantine (NAMENDA) 5 MG tablet Take 5 mg by mouth 2 (two) times daily. At 10am and 5 pm      . Multiple Vitamins-Minerals (  ADULT ONE DAILY GUMMIES) CHEW Chew 0.5 tablets by mouth daily.      Marland Kitchen rOPINIRole (REQUIP) 1 MG tablet Take 1 mg by mouth at bedtime.        Marland Kitchen warfarin (COUMADIN) 2 MG tablet Take 3-4 mg by mouth See admin instructions. Take 3mg  (1 &1/2 tabs) on Tuesdays through Sunday. Take two tablets (4mg ) on Monday only       Assessment: INR therapeutic Awaiting ortho consult  Goal of Therapy:  INR 2-3   Plan: Coumadin 2mg  today x 1 (unless stopped by ortho) INR daily until stable Consider Lovenox coverage if surgery imminent  Valrie Hart A 09/12/2011,11:01 AM

## 2011-09-12 NOTE — Consult Note (Signed)
Reason for Consult:fracture of right shoulder and pelvis Referring Physician: hospitalist  [name] is an 78 y.o. female.  HPI: She fell at home and hurt her right shoulder and right hip area.  She was unable to stand.  Xrays here revealed fracture of the proximal humerus on the right.  Initial xrays of the pelvis were negative but CT scan was done showing fracture of left pubic body and ramis, fracture of both inferior and superior right pubic rami, all nondisplaced.    She has multiple medical problems.  Her pain is controlled.  She is unable to stand or get out of bed for the examination.  She is disoriented.  Past Medical History  Diagnosis Date  . Hypertension     Lab 04/2011: Normal CMet except BUN of 29 and creatinine of 1.36  . Hyperlipidemia     Lipid profile in 05/2009:163, 78, 58, 89; 09/2010:179, 101, 61, 98  . Atrial fibrillation      thromboembolic CVA (right cerebral) in 10/2005  . Chronic anticoagulation      nl CBC in 04/2011  . PVD (peripheral vascular disease)     -no claudication; distal pulses are decreased  . Kyphosis   . Chronic kidney disease     -exacerbated by diuresis; creatinine of 1.8 in 6/98, 1.7 in 11/2006 and 1.36 in 3/09 & 05/2008, 1.38 in 05/2009; 1.36 in 04/2011  . Weight loss   . Hyperkalemia     5.7 in 3/09; 4.5 and 1/10  . Dyspnea   . Macular degeneration   . Osteoporosis     vertebral fracture  . TIA (transient ischemic attack)     2007    Past Surgical History  Procedure Date  . Cholecystectomy 1975  . Appendectomy     Family History  Problem Relation Age of Onset  . Heart disease Brother   . Heart disease Brother   . Heart disease Sister   . Heart disease Sister   . Heart attack Sister     Social History:  reports that she has never smoked. Her smokeless tobacco use includes Snuff. She reports that she does not drink alcohol or use illicit drugs.  Allergies: No Known Allergies  Medications: I have reviewed the  patient's current medications.  Results for orders placed during the hospital encounter of 09/11/11 (from the past 48 hour(s))  CBC     Status: Abnormal   Collection Time   09/11/11  9:19 PM      Component Value Range Comment   WBC 10.7 (*) 4.0 - 10.5 (K/uL)    RBC 4.33  3.87 - 5.11 (MIL/uL)    Hemoglobin 13.3  12.0 - 15.0 (g/dL)    HCT 16.1  09.6 - 04.5 (%)    MCV 92.4  78.0 - 100.0 (fL)    MCH 30.7  26.0 - 34.0 (pg)    MCHC 33.3  30.0 - 36.0 (g/dL)    RDW 40.9  81.1 - 91.4 (%)    Platelets 187  150 - 400 (K/uL)   DIFFERENTIAL     Status: Abnormal   Collection Time   09/11/11  9:19 PM      Component Value Range Comment   Neutrophils Relative 89 (*) 43 - 77 (%)    Neutro Abs 9.5 (*) 1.7 - 7.7 (K/uL)    Lymphocytes Relative 7 (*) 12 - 46 (%)    Lymphs Abs 0.8  0.7 - 4.0 (K/uL)    Monocytes Relative 4  3 - 12 (%)    Monocytes Absolute 0.5  0.1 - 1.0 (K/uL)    Eosinophils Relative 0  0 - 5 (%)    Eosinophils Absolute 0.0  0.0 - 0.7 (K/uL)    Basophils Relative 0  0 - 1 (%)    Basophils Absolute 0.0  0.0 - 0.1 (K/uL)   BASIC METABOLIC PANEL     Status: Abnormal   Collection Time   09/11/11  9:19 PM      Component Value Range Comment   Sodium 139  135 - 145 (mEq/L)    Potassium 4.3  3.5 - 5.1 (mEq/L)    Chloride 103  96 - 112 (mEq/L)    CO2 25  19 - 32 (mEq/L)    Glucose, Bld 161 (*) 70 - 99 (mg/dL)    BUN 25 (*) 6 - 23 (mg/dL)    Creatinine, Ser 1.61 (*) 0.50 - 1.10 (mg/dL)    Calcium 9.2  8.4 - 10.5 (mg/dL)    GFR calc non Af Amer 38 (*) >90 (mL/min)    GFR calc Af Amer 44 (*) >90 (mL/min)   URINALYSIS, ROUTINE W REFLEX MICROSCOPIC     Status: Abnormal   Collection Time   09/11/11 10:14 PM      Component Value Range Comment   Color, Urine YELLOW  YELLOW     APPearance CLEAR  CLEAR     Specific Gravity, Urine 1.015  1.005 - 1.030     pH 6.5  5.0 - 8.0     Glucose, UA NEGATIVE  NEGATIVE (mg/dL)    Hgb urine dipstick TRACE (*) NEGATIVE     Bilirubin Urine NEGATIVE   NEGATIVE     Ketones, ur NEGATIVE  NEGATIVE (mg/dL)    Protein, ur NEGATIVE  NEGATIVE (mg/dL)    Urobilinogen, UA 1.0  0.0 - 1.0 (mg/dL)    Nitrite NEGATIVE  NEGATIVE     Leukocytes, UA NEGATIVE  NEGATIVE    URINE MICROSCOPIC-ADD ON     Status: Abnormal   Collection Time   09/11/11 10:14 PM      Component Value Range Comment   Squamous Epithelial / LPF RARE  RARE     WBC, UA 0-2  <3 (WBC/hpf)    RBC / HPF 3-6  <3 (RBC/hpf)    Bacteria, UA FEW (*) RARE    PROTIME-INR     Status: Abnormal   Collection Time   09/12/11 12:12 AM      Component Value Range Comment   Prothrombin Time 23.8 (*) 11.6 - 15.2 (seconds)    INR 2.09 (*) 0.00 - 1.49    MRSA PCR SCREENING     Status: Normal   Collection Time   09/12/11  3:41 AM      Component Value Range Comment   MRSA by PCR NEGATIVE  NEGATIVE    MAGNESIUM     Status: Normal   Collection Time   09/12/11  4:26 AM      Component Value Range Comment   Magnesium 2.0  1.5 - 2.5 (mg/dL)   TSH     Status: Normal   Collection Time   09/12/11  4:26 AM      Component Value Range Comment   TSH 3.348  0.350 - 4.500 (uIU/mL)   CBC     Status: Normal   Collection Time   09/12/11  4:26 AM      Component Value Range Comment   WBC 8.5  4.0 - 10.5 (  K/uL)    RBC 4.04  3.87 - 5.11 (MIL/uL)    Hemoglobin 12.4  12.0 - 15.0 (g/dL)    HCT 78.2  95.6 - 21.3 (%)    MCV 92.8  78.0 - 100.0 (fL)    MCH 30.7  26.0 - 34.0 (pg)    MCHC 33.1  30.0 - 36.0 (g/dL)    RDW 08.6  57.8 - 46.9 (%)    Platelets 179  150 - 400 (K/uL)   COMPREHENSIVE METABOLIC PANEL     Status: Abnormal   Collection Time   09/12/11  4:26 AM      Component Value Range Comment   Sodium 139  135 - 145 (mEq/L)    Potassium 4.1  3.5 - 5.1 (mEq/L)    Chloride 104  96 - 112 (mEq/L)    CO2 25  19 - 32 (mEq/L)    Glucose, Bld 152 (*) 70 - 99 (mg/dL)    BUN 24 (*) 6 - 23 (mg/dL)    Creatinine, Ser 6.29 (*) 0.50 - 1.10 (mg/dL)    Calcium 8.8  8.4 - 10.5 (mg/dL)    Total Protein 6.4  6.0 - 8.3 (g/dL)     Albumin 3.0 (*) 3.5 - 5.2 (g/dL)    AST 30  0 - 37 (U/L)    ALT 20  0 - 35 (U/L)    Alkaline Phosphatase 62  39 - 117 (U/L)    Total Bilirubin 0.5  0.3 - 1.2 (mg/dL)    GFR calc non Af Amer 40 (*) >90 (mL/min)    GFR calc Af Amer 47 (*) >90 (mL/min)   PROTIME-INR     Status: Abnormal   Collection Time   09/12/11  4:26 AM      Component Value Range Comment   Prothrombin Time 25.0 (*) 11.6 - 15.2 (seconds)    INR 2.22 (*) 0.00 - 1.49    GLUCOSE, CAPILLARY     Status: Abnormal   Collection Time   09/12/11  7:51 AM      Component Value Range Comment   Glucose-Capillary 129 (*) 70 - 99 (mg/dL)    Comment 1 Notify RN     T4, FREE     Status: Normal   Collection Time   09/12/11  9:44 AM      Component Value Range Comment   Free T4 1.02  0.80 - 1.80 (ng/dL)   VITAMIN B28     Status: Normal   Collection Time   09/12/11  9:44 AM      Component Value Range Comment   Vitamin B-12 455  211 - 911 (pg/mL)     Dg Chest 1 View  09/11/2011  *RADIOLOGY REPORT*  Clinical Data: Pain after fall; right humerus fracture  CHEST - 1 VIEW  Comparison: June 07, 2010  Findings: Cardiomegaly is unchanged.  The mediastinum and pulmonary vasculature are within normal limits.  There is no evidence of gross rib fracture or pneumothorax.  No infiltrates or effusions are identified.  IMPRESSION: Cardiomegaly, unchanged.  Original Report Authenticated By: Brandon Melnick, M.D.   Dg Pelvis 1-2 Views  09/11/2011  *RADIOLOGY REPORT*  Clinical Data: Pain after fall  PELVIS - 1-2 VIEW  Comparison: None.  Findings:  There is no evidence of pelvic fracture or dislocation. The bones are osteopenic.  Vascular calcifications are present. There are degenerative changes at the lower lumbar spine.  IMPRESSION: No evidence of pelvic fracture or dislocation.  Original Report Authenticated By: Rolan Bucco  B. Kearney Hard, M.D.   Dg Shoulder Right  09/11/2011  *RADIOLOGY REPORT*  Clinical Data: Fall.  Shoulder pain.  RIGHT SHOULDER - 2+ VIEW   Comparison: None.  Findings: The patient has an impacted surgical neck fracture of the right humerus with extension into the greater tuberosity. Acromioclavicular joint is intact with degenerative change noted. Imaged right lung and ribs appear normal.  IMPRESSION: Impacted surgical neck fracture of the right humerus involves the greater tuberosity.  Original Report Authenticated By: Bernadene Bell. D'ALESSIO, M.D.   Dg Forearm Right  09/11/2011  *RADIOLOGY REPORT*  Clinical Data: Pain after fall  RIGHT FOREARM - 2 VIEW  Comparison:  None  Findings: There is no evidence of fracture or dislocation.  The bones are demineralized.  There is evidence of osteoarthritis the first CMC joint.  Vascular calcifications are present.  IMPRESSION:  .No evidence of fracture or dislocation.  Original Report Authenticated By: Brandon Melnick, M.D.   Ct Head Wo Contrast  09/12/2011  *RADIOLOGY REPORT*  Clinical Data: Fall.  History of mini strokes.  CT HEAD WITHOUT CONTRAST  Technique:  Contiguous axial images were obtained from the base of the skull through the vertex without contrast.  Comparison: Head CT 11/17/2005  Findings: There is moderate periventricular patchy white matter hypoattenuation suggesting chronic microvascular ischemic changes. The ventricles are normal in size.  Slight age-related cerebral atrophy.  Negative for hemorrhage, mass effect, mass lesion, or evidence of acute cortically based infarction. The visualized paranasal sinuses, mastoid air cells, and middle ears are clear. Thickened walls of the maxillary sinuses bilaterally, chronic, and may be due to prior infection.  IMPRESSION:  1.  No acute intracranial abnormality. 2.  Chronic extensive microvascular ischemic changes.  Original Report Authenticated By: Britta Mccreedy, M.D.   Ct Pelvis Wo Contrast  09/12/2011  *RADIOLOGY REPORT*  Clinical Data:  Bilateral hip pain.  Fall on 09/11/2011.  CT PELVIS WITHOUT CONTRAST  Technique:  Multidetector CT imaging of  the pelvis was performed following the standard protocol without intravenous contrast.  Comparison:  09/11/2011  Findings:  Marked bony demineralization noted.  Abnormal anterior spur like configuration along the right sacrum is compatible with right sacral alar fracture.  There is an acute fracture of the left pubic body involving the margins of the superior and inferior pubic rami.  There is also a nondisplaced fracture of both inferior pubic rami, and a fracture of the lateral margin of the right superior pubic ramus extending into the anterior acetabular wall.  No cortical discontinuity in the proximal femurs to suggest proximal femoral fracture.  Aortoiliac atherosclerotic calcification noted.  The right inguinal hernia contains adipose tissue.  Right gluteus medius atrophy noted mild asymmetry of the right upper psoas muscle compared to the left favors a right psoas hematoma.  There is atrophy of the right inferior rectus abdominus muscle.  Anterior subluxation of L4 on L5 by 4 mm noted, with probable fusion of the L4 and L5 facets.  There is 5 mm of anterior subluxation of L5 on S1 associated with a 50% compression fracture of L5 which is age indeterminate.  IMPRESSION:  1.  Acute fractures of the left pubic body with extension into the adjacent pubic rami, the left inferior pubic ramus, the right inferior pubic ramus, lateral portion of the right superior pubic ramus extending into the anterior wall of the acetabulum, and of the right sacrum. 2.  Indeterminate 50% compression fracture of L5 - probably old. Lower lumbar spondylosis and lower lumbar  chronic degenerative subluxations noted. 3.  Abnormal prominence the right psoas muscle compared the left, suspicious for psoas hematoma.  The superior extent of this process is not included on today's pelvis CT. 4.  Aortoiliac atherosclerotic calcification. 5.  Small right inguinal hernia contains adipose tissue. 6.  Prominent bony demineralization.  The results  of this exam were flagged for direct telephone communication of report at the time of report approval. Documentation of communication is available under the workflow layer of [employer].  Original Report Authenticated By: Dellia Cloud, M.D.   Dg Humerus Right  09/11/2011  *RADIOLOGY REPORT*  Clinical Data: Pain after fall  RIGHT HUMERUS - 2+ VIEW  Comparison: None.  Findings: There is a comminuted fracture of the proximal humerus which is best seen on the shoulder films.  IMPRESSION: Comminuted fracture of the proximal right humerus.  Original Report Authenticated By: Brandon Melnick, M.D.    Review of Systems  Constitutional: Positive for weight loss and malaise/fatigue.  HENT: Negative.   Eyes:       Macular degeneration.  Respiratory: Negative.   Cardiovascular: Positive for palpitations.       Atrial fibrillation.  Gastrointestinal: Negative.   Genitourinary:       Chronic kidney disease  Musculoskeletal: Positive for falls (pain right shoulder and right hip area.).  Skin: Negative.   Neurological: Positive for weakness.       Dementia.  Endo/Heme/Allergies:       Anti-coagulated.  Psychiatric/Behavioral: Negative.    Blood pressure 79/62, pulse 90, temperature 98.1 F (36.7 C), temperature source Axillary, resp. rate 23, height 4\' 8"  (1.422 m), weight 51.1 kg (112 lb 10.5 oz), SpO2 97.00%. Physical Exam  Constitutional: She appears well-developed.  HENT:  Head: Normocephalic and atraumatic.  Eyes: Conjunctivae and EOM are normal. Pupils are equal, round, and reactive to light.  Neck: Normal range of motion. Neck supple.  Cardiovascular: Intact distal pulses.   Respiratory: Effort normal and breath sounds normal.  GI: Soft. Bowel sounds are normal.  Musculoskeletal: She exhibits tenderness (right shoulder with pain to motion and ecchymosis.  Pain right hip area with no shortening or rotation .).       Right shoulder: She exhibits decreased range of motion, tenderness,  swelling and decreased strength.       Arms:      Legs: Neurological: She is alert. She has normal reflexes.  Skin: Skin is warm and dry.  Psychiatric: She has a normal mood and affect. Her behavior is normal.       Disoriented.    Assessment/Plan: Fracture of the right proximal humerus and of the pelvis.  Plan:  Shoulder immobilizer on the right; bed rest for now then PT and help to chair beginning tomorrow.  No surgery is indicated.  [name] 09/12/2011, 5:08 PM

## 2011-09-12 NOTE — Progress Notes (Signed)
UR Chart Review Completed  

## 2011-09-12 NOTE — Consult Note (Addendum)
CARDIOLOGY CONSULT NOTE  Patient ID: [name] MRN: [id number] DOB/AGE: 1925-12-16 78 y.o.  Admit date: 09/11/2011 Referring Physician: PTH Primary PhysicianGOLDING,JOHN CABOT, MD, MD Primary Cardiologist: Dietrich Pates Reason for Consultation: Active Problems:  Hypertension  Hyperlipidemia  Chronic anticoagulation  Chronic kidney disease  Atrial fibrillation  Failure to thrive  Fracture of right humerus  Dementia  HPI: [name] is a 78 year old patient of Dr. Dietrich Pates we follow for atrial fibrillation on chronic Coumadin therapy with a history of hypertension, hyperlipidemia, CVA, chronic  kidney disease, and mild dementia. She presented to the emergency room after falling at home while standing in the kitchen. She did not lose consciousness. She was found to have a comminuted  fracture of the proximal humerus. EKG revealed atrial fibrillation without evidence of pauses or bradycardia. According to ER notes and family members. The patient has been falling a lot over the last 2 weeks but has not sustained any injuries.   On arrival in the ER. The patient's blood pressure was 146/65 with a pulse of 56, respirations 16, O2 sat 95%. This x-ray revealed cardiomegaly, unchanged without evidence of gross rib fracture or pneumothorax. There were no infiltrates noted. She was treated with morphine for pain and admitted. On arrival to the lower the patient's HR was in the 120s to 150 minutes A. fib with RVR. She was started on a Cardizem drip. This is now been discontinued in favor of oral medication.     Review of systems complete and found to be negative unless listed above   Past Medical History  Diagnosis Date  . Hypertension     Lab 04/2011: Normal CMet except BUN of 29 and creatinine of 1.36  . Hyperlipidemia     Lipid profile in 05/2009:163, 78, 58, 89; 09/2010:179, 101, 61, 98  . Atrial fibrillation      thromboembolic CVA (right cerebral) in 10/2005  . Chronic anticoagulation       nl CBC in 04/2011  . PVD (peripheral vascular disease)     -no claudication; distal pulses are decreased  . Kyphosis   . Chronic kidney disease     -exacerbated by diuresis; creatinine of 1.8 in 6/98, 1.7 in 11/2006 and 1.36 in 3/09 & 05/2008, 1.38 in 05/2009; 1.36 in 04/2011  . Weight loss   . Hyperkalemia     5.7 in 3/09; 4.5 and 1/10  . Dyspnea   . Macular degeneration   . Osteoporosis     vertebral fracture  . TIA (transient ischemic attack)     2007    Family History  Problem Relation Age of Onset  . Heart disease Brother   . Heart disease Brother   . Heart disease Sister   . Heart disease Sister   . Heart attack Sister     History   Social History  . Marital Status: Legally Separated    Spouse Name: N/A    Number of Children: N/A  . Years of Education: N/A   Occupational History  . Retired    Social History Main Topics  . Smoking status: Never Smoker   . Smokeless tobacco: Current User    Types: Snuff  . Alcohol Use: No  . Drug Use: No  . Sexually Active: No   Other Topics Concern  . Not on file   Social History Narrative   SingleDoes not drive, never hasNo regular exercise    Past Surgical History  Procedure Date  . Cholecystectomy 1975  . Appendectomy  Prescriptions prior to admission  Medication Sig Dispense Refill  . alendronate (FOSAMAX) 70 MG tablet Take 70 mg by mouth every 7 (seven) days. Take in the morning with a full glass of water, on an empty stomach, and do not take anything else by mouth or lie down for the next 30 min.       . bimatoprost (LUMIGAN) 0.03 % ophthalmic drops Place 1 drop into both eyes at bedtime.        . cetirizine (ZYRTEC) 10 MG tablet Take 10 mg by mouth at bedtime.       . citalopram (CELEXA) 20 MG tablet Take 20 mg by mouth every morning.       Marland Kitchen COENZYME Q-10 PO Take 10 mg by mouth daily.        . diazepam (VALIUM) 2 MG tablet Take 2-4 mg by mouth at bedtime.       . docusate sodium (COLACE) 100 MG capsule  Take 100 mg by mouth 3 (three) times daily.        Marland Kitchen donepezil (ARICEPT) 5 MG tablet Take 5 mg by mouth at bedtime.      . furosemide (LASIX) 40 MG tablet Take 40 mg by mouth every morning.       Marland Kitchen ibuprofen (ADVIL,MOTRIN) 600 MG tablet Take 600 mg by mouth as needed. For pain      . memantine (NAMENDA) 5 MG tablet Take 5 mg by mouth 2 (two) times daily. At 10am and 5 pm      . Multiple Vitamins-Minerals (ADULT ONE DAILY GUMMIES) CHEW Chew 0.5 tablets by mouth daily.      Marland Kitchen rOPINIRole (REQUIP) 1 MG tablet Take 1 mg by mouth at bedtime.        Marland Kitchen warfarin (COUMADIN) 2 MG tablet Take 3-4 mg by mouth See admin instructions. Take 3mg  (1 &1/2 tabs) on Tuesdays through Sunday. Take two tablets (4mg ) on Monday only        Physical Exam: Blood pressure 79/62, pulse 90, temperature 98.1 F (36.7 C), temperature source Axillary, resp. rate 23, height 4\' 8"  (1.422 m), weight 112 lb 10.5 oz (51.1 kg), SpO2 97.00%.  General: Well developed, well nourished, sleeping, difficult to arouse.  Head: Eyes PERRLA, No xanthomas.   Normal cephalic and atramatic; poor dentition  Lungs: Clear bilaterally to auscultation anterior  Heart: HR irregular,  1/6 systolic murmur..  Pulses are 2+ & equal.            No carotid bruit. No JVD.  No abdominal bruits. No femoral bruits. Abdomen: Bowel sounds are positive, abdomen soft and non-tender without masses or                  Hernia's noted.Minimal epigastric bruit Msk:  Kyphosis is noted, sling to right arm. No clubbing, cyanosis, or edema.. Extremities: No clubbing, cyanosis; trace edema.  DP +1  Labs:   Lab Results  Component Value Date   WBC 8.5 09/12/2011   HGB 12.4 09/12/2011   HCT 37.5 09/12/2011   MCV 92.8 09/12/2011   PLT 179 09/12/2011    Lab 09/12/11 0426  NA 139  K 4.1  CL 104  CO2 25  BUN 24*  CREATININE 1.21*  CALCIUM 8.8  PROT 6.4  BILITOT 0.5  ALKPHOS 62  ALT 20  AST 30  GLUCOSE 152*   No results found for this basename: CKTOTAL, CKMB,  CKMBINDEX, TROPONINI    Lab Results  Component Value Date   CHOL  144 03/16/2010   CHOL 163 06/12/2009   CHOL 200 05/27/2008   Lab Results  Component Value Date   HDL 61 03/16/2010   HDL 58 06/12/2009   HDL 57 08/31/9627   Lab Results  Component Value Date   LDLCALC 68 03/16/2010   LDLCALC 89 06/12/2009   LDLCALC 104 05/27/2008   Lab Results  Component Value Date   TRIG 77 03/16/2010   TRIG 78 06/12/2009   TRIG 196 05/27/2008   Lab Results  Component Value Date   CHOLHDL 2.8 Ratio 06/12/2009   No results found for this basename: LDLDIRECT    Radiology: Dg Chest 1 View  09/11/2011  *RADIOLOGY REPORT*  Clinical Data: Pain after fall; right humerus fracture  CHEST - 1 VIEW  Comparison: June 07, 2010  Findings: Cardiomegaly is unchanged.  The mediastinum and pulmonary vasculature are within normal limits.  There is no evidence of gross rib fracture or pneumothorax.  No infiltrates or effusions are identified.  IMPRESSION: Cardiomegaly, unchanged.    EKG: Atrial fib with nonspecific T-wave abnormality   ASSESSMENT AND PLAN:   1. Atrial fibrillation: Coumadin therapy is certainly of concern in the setting of frequent falls in this nice woman.   She apparently has been having more frequent falls over the last month with this one resulting in a fractured humerus. Heart rate control appears to be improved on current medication with one episode of A. fib with RVR in the early morning hours. She is currently on diltiazem 30 mg every 8 hours. Home medication list metoprolol 25 mg by mouth daily. Would continue the diltiazem if blood pressure tolerates.  2. Hypertension. Currently low. She is being provided pain medication, which is probably affecting this. She was on Lasix at home . This has not been restarted currently. Creatinine 1.21 today and 1.27 on admission.  3. Dementia. She is on medications at home to include Aricept and Valium.   Bettey Mare. Lyman Bishop NP [facility] 09/12/2011, 3:19 PM   Cardiology Attending Patient interviewed and examined. Discussed with Joni Reining, NP.  Above note annotated and modified based upon my findings.  Patient was recently seen in the office at which time falls were discussed and strategies suggested to prevent additional episodes. This apparently was not successful. There is not much in the history to suggest loss of consciousness, which would tend to exclude orthostatic hypotension and arrhythmia as likely causes of her events.  Elevated heart rate on admission may have reflected psychologic and physiologic stress, as her heart rate control was good when she was seen in the office just a few days earlier. Yes, there is a risk of intracerebral bleed with additional falls, but there is also a substantial risk of thromboembolism if anticoagulation is not continued. She should be fully assessed in hospital including by physical therapy to determine the reasons for her falls and what can be done to decrease the likelihood of recurrent episodes. Anticoagulation should be continued for now. If she requires transfer to a skilled care facility, the risk of fall will be minimized. If not, it will be necessary to determine whether she can receive adequate care at home.  Zalma Bing, MD 09/12/2011, 5:09 PM

## 2011-09-12 NOTE — Progress Notes (Addendum)
The patient is an 78 year old woman with a history of chronic atrial fibrillation, hypertension, TIA, and dementia, who was admitted this morning for frequent falls, right humeral fracture, and mild RVR. She was briefly seen. Her vital signs and laboratory data an overall chart were reviewed. See dictated history and physical by Dr. Orvan Falconer. Will discontinue Cardizem drip in favor of oral Cardizem. Cardiology has been consulted for evaluation of RVR and ongoing anticoagulation in a patient with frequent falls.pelvis CT has been ordered. TSH has been ordered. Will order a vitamin B12 level and hemoglobin A1c. Physical therapy has been ordered. I informed the patient's daughter, that there is always a potential for diazepam and Requip causing falls, but she stated that the patient has been on these medications for years. Will get ortho consultation for humeral fracture.

## 2011-09-12 NOTE — Progress Notes (Signed)
0515-notified MD of HR of 120's-150's.

## 2011-09-12 NOTE — ED Notes (Signed)
Pt informed of possible admission, states her pain is good now.

## 2011-09-13 ENCOUNTER — Encounter (HOSPITAL_COMMUNITY): Payer: Self-pay | Admitting: Internal Medicine

## 2011-09-13 DIAGNOSIS — S3282XA Multiple fractures of pelvis without disruption of pelvic ring, initial encounter for closed fracture: Secondary | ICD-10-CM

## 2011-09-13 DIAGNOSIS — S32050A Wedge compression fracture of fifth lumbar vertebra, initial encounter for closed fracture: Secondary | ICD-10-CM | POA: Insufficient documentation

## 2011-09-13 DIAGNOSIS — D696 Thrombocytopenia, unspecified: Secondary | ICD-10-CM | POA: Insufficient documentation

## 2011-09-13 DIAGNOSIS — S301XXA Contusion of abdominal wall, initial encounter: Secondary | ICD-10-CM | POA: Insufficient documentation

## 2011-09-13 HISTORY — DX: Multiple fractures of pelvis without disruption of pelvic ring, initial encounter for closed fracture: S32.82XA

## 2011-09-13 HISTORY — DX: Wedge compression fracture of fifth lumbar vertebra, initial encounter for closed fracture: S32.050A

## 2011-09-13 HISTORY — DX: Contusion of abdominal wall, initial encounter: S30.1XXA

## 2011-09-13 LAB — CBC
HCT: 35 % — ABNORMAL LOW (ref 36.0–46.0)
MCH: 30.5 pg (ref 26.0–34.0)
MCV: 94.6 fL (ref 78.0–100.0)
RBC: 3.7 MIL/uL — ABNORMAL LOW (ref 3.87–5.11)
WBC: 7.5 10*3/uL (ref 4.0–10.5)

## 2011-09-13 LAB — BASIC METABOLIC PANEL
BUN: 24 mg/dL — ABNORMAL HIGH (ref 6–23)
CO2: 23 mEq/L (ref 19–32)
Calcium: 8.5 mg/dL (ref 8.4–10.5)
Chloride: 106 mEq/L (ref 96–112)
Creatinine, Ser: 1.18 mg/dL — ABNORMAL HIGH (ref 0.50–1.10)
Glucose, Bld: 105 mg/dL — ABNORMAL HIGH (ref 70–99)

## 2011-09-13 MED ORDER — SODIUM CHLORIDE 0.9 % IJ SOLN
INTRAMUSCULAR | Status: AC
  Administered 2011-09-13: 17:00:00
  Filled 2011-09-13: qty 3

## 2011-09-13 MED ORDER — OXYCODONE HCL 5 MG PO TABS
ORAL_TABLET | ORAL | Status: AC
  Filled 2011-09-13: qty 1

## 2011-09-13 MED ORDER — WARFARIN SODIUM 2 MG PO TABS
4.0000 mg | ORAL_TABLET | Freq: Once | ORAL | Status: AC
  Administered 2011-09-13: 4 mg via ORAL
  Filled 2011-09-13: qty 2

## 2011-09-13 NOTE — Progress Notes (Addendum)
SUBJECTIVE: Awake, complaining of right arm pain. No chest pain or palpitations  Active Problems:  Hypertension  Hyperlipidemia  Chronic anticoagulation  Chronic kidney disease  Atrial fibrillation  Failure to thrive  Fracture of right humerus  Dementia  Multiple pelvic fractures  Psoas hematoma, left, secondary to anticoagulant therapy  Compression fracture of L5 lumbar vertebra   LABS: Basic Metabolic Panel:  Basename 09/13/11 0434 09/12/11 0426  NA 136 139  K 4.5 4.1  CL 106 104  CO2 23 25  GLUCOSE 105* 152*  BUN 24* 24*  CREATININE 1.18* 1.21*  CALCIUM 8.5 8.8  MG -- 2.0  PHOS -- --   Liver Function Tests:  [facility] 09/12/11 0426  AST 30  ALT 20  ALKPHOS 62  BILITOT 0.5  PROT 6.4  ALBUMIN 3.0*   No results found for this basename: LIPASE:2,AMYLASE:2 in the last 72 hours CBC:  Basename 09/13/11 0434 09/12/11 0426 09/11/11 2119  WBC 7.5 8.5 --  NEUTROABS -- -- 9.5*  HGB 11.3* 12.4 --  HCT 35.0* 37.5 --  MCV 94.6 92.8 --  PLT 139* 179 --   Thyroid Function Tests:  Basename 09/12/11 0426  TSH 3.348  T4TOTAL --  T3FREE --  THYROIDAB --   PHYSICAL EXAM BP 133/66  Pulse 65  Temp(Src) 98.4 F (36.9 C) (Axillary)  Resp 18  Ht 4\' 8"  (1.422 m)  Wt 118 lb 13.3 oz (53.9 kg)  BMI 26.64 kg/m2  SpO2 95% General: Well developed, well nourished, in no acute distress Head: Eyes PERRLA, No xanthomas.   Normal cephalic and atramatic  Lungs: Clear bilaterally to auscultation and percussion. Heart: HRIR Soft 1/6 systolic murmur. .  Pulses are 2+ & equal.            No carotid bruit. No JVD.  No abdominal bruits. No femoral bruits. Abdomen: Bowel sounds are positive, abdomen soft and non-tender without masses or                  Hernia's noted. Msk:  Back normal, normal gait. Normal strength and tone for age. Extremities: No clubbing, cyanosis or edema.  DP +1, right arm very tender, does not move it. Neuro: Alert and oriented X 3. Psych:  Good affect,  responds appropriately  TELEMETRY: Reviewed telemetry pt in Atrial fib rate of 70's.  ASSESSMENT AND PLAN:  1. Atrial fibrillation: Patient's heart is well-controlled on Cardizem. Would not restart metoprolol as Cardizem is clearly more effective in her case. Per Dr. Marvel Plan note the patient's Coumadin will be continued as risks of CVA outweighing risks of bleeding in this setting. She is going to be moved to skilled nursing facility on discharge for physical therapy. She will have more nursing care and surveillance to assist with ambulation. This will be helpful in preventing frequency. For now continue current medication regimen.  2. Hypertension: Blood pressure is currently very well-controlled on current medication regimen. Would continue this on discharge. Overall, she is doing well and anticipate transfer to skilled nursing facility soon. We will be happy to follow her as an outpatient and to continue arrhythmia and anticoagulation management.   Bettey Mare. Lyman Bishop NP [facility] 09/13/2011, 8:31 AM   Cardiology Attending Patient interviewed and examined. Discussed with Joni Reining, NP.  Above note annotated and modified based upon my findings.  Patient is doing very well from a cardiovascular standpoint with excellent control of heart rate and blood pressure. It should be possible to maintain chronic anticoagulation safely  at a SNF.   If she ultimately returns home and continues to ambulate without supervision, we will need to consider whether continued anticoagulation would be safe for her under those circumstances.  [place] Bing, MD 09/13/2011, 5:29 PM

## 2011-09-13 NOTE — Progress Notes (Signed)
   CARE MANAGEMENT NOTE 09/13/2011  Patient:  [name], [name]   Account Number:  [id number]  Date Initiated:  09/13/2011  Documentation initiated by:  Rosemary Holms  Subjective/Objective Assessment:   Pt admitted from home with care given by son, Cathlean Sauer and daughter, [name] over the weekends. Baseline is up with walker. Gets her coumadin checked at the [place] [facility]     Action/Plan:   Yesterday family wished to take pt home at discharge and request [facility] and DME from [facility]. Per CSW, family is now wanting placement in SNF.   Anticipated DC Date:  09/16/2011   Anticipated DC Plan:  SKILLED NURSING FACILITY      DC Planning Services  CM consult      Choice offered to / List presented to:             Status of service:  In process, will continue to follow Medicare Important Message given?   (If response is "NO", the following Medicare IM given date fields will be blank) Date Medicare IM given:   Date Additional Medicare IM given:    Discharge Disposition:    Per UR Regulation:    If discussed at Long Length of Stay Meetings, dates discussed:    Comments:  09/13/11 1615 Akhilesh Sassone Leanord Hawking RN BSN CM

## 2011-09-13 NOTE — Evaluation (Signed)
Physical Therapy Evaluation Patient Details Name: [name] MRN: [id number] DOB: [birth year] Today's Date: 09/13/2011 Time: 4098-1191 PT Time Calculation (min): 51 min  PT Assessment / Plan / Recommendation Clinical Impression  very pleasant and cooperative pt who is nearly blind, now with bilateral pelvic fxs and R shoulder fx...pain is well controlled and she was able to begin mobilization with ther ex to both LEs followed by transfer training bed to chair...max assist needed for transfer at this time...she will need SNF at d/c    PT Assessment  Patient needs continued PT services    Follow Up Recommendations  Skilled nursing facility    Equipment Recommendations  Defer to next venue    Frequency Min 5X/week    Precautions / Restrictions Precautions Precautions: Fall;Shoulder Type of Shoulder Precautions: R shoulder fx- in sling- no  weight bearing Precaution Booklet Issued: No Restrictions Weight Bearing Restrictions: No   Pertinent Vitals/Pain   Minimal pain with motion,vital signs WNL *      Mobility  Bed Mobility Bed Mobility: Supine to Sit;Sitting - Scoot to Edge of Bed Supine to Sit: HOB elevated;1: +1 Total assist Sitting - Scoot to Edge of Bed: 1: +1 Total assist Transfers Transfers: Sit to Stand;Stand to Sit;Stand Pivot Transfers Sit to Stand: 2: Max assist;From bed;From chair/3-in-1;With upper extremity assist Stand to Sit: 3: Mod assist;To chair/3-in-1;Without upper extremity assist Stand Pivot Transfers: 2: Max assist Ambulation/Gait Ambulation/Gait Assistance: Not tested (comment) (per MD order) Stairs: No Wheelchair Mobility Wheelchair Mobility: No    Exercises General Exercises - Lower Extremity Ankle Circles/Pumps: AROM;Both;10 reps;Supine Short Arc Quad: AROM;Both;10 reps;Supine Heel Slides: AAROM;Both;5 reps;Supine Hip ABduction/ADduction: AAROM;Both;5 reps;Supine   PT Goals Acute Rehab PT Goals PT Goal Formulation: With  patient/family Pt will go Supine/Side to Sit: with max assist PT Goal: Supine/Side to Sit - Progress: Goal set today Pt will go Sit to Supine/Side: with max assist PT Goal: Sit to Supine/Side - Progress: Goal set today Pt will go Sit to Stand: with mod assist PT Goal: Sit to Stand - Progress: Goal set today Pt will go Stand to Sit: with min assist PT Goal: Stand to Sit - Progress: Goal set today Pt will Transfer Bed to Chair/Chair to Bed: with mod assist PT Transfer Goal: Bed to Chair/Chair to Bed - Progress: Goal set today  Visit Information  Last PT Received On: 09/13/11    Subjective Data  Subjective: daughter states that pt is nearly blind Patient Stated Goal: wants to return home to prior level   Prior Functioning  Home Living Lives With: Son;Daughter Available Help at Discharge: Family Type of Home: House Home Access: Stairs to enter Secretary/administrator of Steps: 2 Entrance Stairs-Rails: Right Home Layout: One level Firefighter: Standard Bathroom Accessibility: Yes How Accessible: Accessible via walker Home Adaptive Equipment: Walker - rolling Prior Function Level of Independence: Independent with assistive device(s) Able to Take Stairs?: Yes Driving: No Vocation: Retired Musician: No difficulties Dominant Hand: Right    Cognition  Overall Cognitive Status: Appears within functional limits for tasks assessed/performed Arousal/Alertness: Awake/alert Orientation Level: Appears intact for tasks assessed Behavior During Session: [facility] for tasks performed Cognition - Other Comments: appears to have memory loss    Extremity/Trunk Assessment Right Upper Extremity Assessment RUE ROM/Strength/Tone: Unable to fully assess Left Upper Extremity Assessment LUE ROM/Strength/Tone: Within functional levels Right Lower Extremity Assessment RLE ROM/Strength/Tone: Deficits;Due to pain RLE ROM/Strength/Tone Deficits: strength is generally 3-/5 RLE  Sensation: WFL - Light Touch RLE Coordination: WFL -  gross/fine motor Left Lower Extremity Assessment LLE ROM/Strength/Tone: Deficits;Due to pain LLE ROM/Strength/Tone Deficits: strength generally 3-/5 LLE Sensation: WFL - Light Touch;WFL - Proprioception LLE Coordination: WFL - gross/fine motor Trunk Assessment Trunk Assessment: Kyphotic   Balance Balance Balance Assessed: No  End of Session PT - End of Session Equipment Utilized During Treatment: Gait belt Activity Tolerance: Patient tolerated treatment well Patient left: in chair;with call bell/phone within reach;with family/visitor present Nurse Communication: Mobility status   Konrad Penta 09/13/2011, 12:08 PM

## 2011-09-13 NOTE — Progress Notes (Signed)
Subjective: The patient has no complaints of arm pain or pelvis pain at rest, but when she attempts to move, the pain occurs. No complaints of chest pain or shortness of breath.  Objective: Vital signs in last 24 hours: Filed Vitals:   09/13/11 0400 09/13/11 0500 09/13/11 0600 09/13/11 0618  BP: 113/60 120/54 125/55 133/66  Pulse: 70 65    Temp: 98.4 F (36.9 C)     TempSrc: Axillary     Resp: 16 16 18    Height:      Weight:  53.9 kg (118 lb 13.3 oz)    SpO2: 93% 95%      Intake/Output Summary (Last 24 hours) at 09/13/11 0839 Last data filed at 09/13/11 0600  Gross per 24 hour  Intake   2565 ml  Output      0 ml  Net   2565 ml    Weight change: 6.272 kg (13 lb 13.3 oz)  Physical exam: Gen.: Elderly 78 year old Caucasian woman lying in bed, in no acute distress. Heart: Irregular, irregular. Abdomen: Positive bowel sounds, soft, mildly tender over the left greater than the right pelvis, and no masses palpated, no rigidity or distention. Extremities: Edematous and tender right arm. She is able to wiggle the fingers on her left hand. No pedal edema.  Lab Results: Basic Metabolic Panel:  Basename 09/13/11 0434 09/12/11 0426  NA 136 139  K 4.5 4.1  CL 106 104  CO2 23 25  GLUCOSE 105* 152*  BUN 24* 24*  CREATININE 1.18* 1.21*  CALCIUM 8.5 8.8  MG -- 2.0  PHOS -- --   Liver Function Tests:  [facility] 09/12/11 0426  AST 30  ALT 20  ALKPHOS 62  BILITOT 0.5  PROT 6.4  ALBUMIN 3.0*   No results found for this basename: LIPASE:2,AMYLASE:2 in the last 72 hours No results found for this basename: AMMONIA:2 in the last 72 hours CBC:  Basename 09/13/11 0434 09/12/11 0426 09/11/11 2119  WBC 7.5 8.5 --  NEUTROABS -- -- 9.5*  HGB 11.3* 12.4 --  HCT 35.0* 37.5 --  MCV 94.6 92.8 --  PLT 139* 179 --   Cardiac Enzymes: No results found for this basename: CKTOTAL:3,CKMB:3,CKMBINDEX:3,TROPONINI:3 in the last 72 hours BNP: No results found for this basename: PROBNP:3 in  the last 72 hours D-Dimer: No results found for this basename: DDIMER:2 in the last 72 hours CBG:  Basename 09/12/11 0751  GLUCAP 129*   Hemoglobin A1C: No results found for this basename: HGBA1C in the last 72 hours Fasting Lipid Panel: No results found for this basename: CHOL,HDL,LDLCALC,TRIG,CHOLHDL,LDLDIRECT in the last 72 hours Thyroid Function Tests:  Basename 09/12/11 0944 09/12/11 0426  TSH -- 3.348  T4TOTAL -- --  FREET4 1.02 --  T3FREE -- --  THYROIDAB -- --   Anemia Panel:  Basename 09/12/11 0944  VITAMINB12 455  FOLATE --  FERRITIN --  TIBC --  IRON --  RETICCTPCT --   Coagulation:  Basename 09/12/11 0426 09/12/11 0012  LABPROT 25.0* 23.8*  INR 2.22* 2.09*   Urine Drug Screen: Drugs of Abuse  No results found for this basename: labopia,  cocainscrnur,  labbenz,  amphetmu,  thcu,  labbarb    Alcohol Level: No results found for this basename: ETH:2 in the last 72 hours Urinalysis:  Basename 09/11/11 2214  COLORURINE YELLOW  LABSPEC 1.015  PHURINE 6.5  GLUCOSEU NEGATIVE  HGBUR TRACE*  BILIRUBINUR NEGATIVE  KETONESUR NEGATIVE  PROTEINUR NEGATIVE  UROBILINOGEN 1.0  NITRITE NEGATIVE  LEUKOCYTESUR NEGATIVE   Misc. Labs:   Micro: Recent Results (from the past 240 hour(s))  MRSA PCR SCREENING     Status: Normal   Collection Time   09/12/11  3:41 AM      Component Value Range Status Comment   MRSA by PCR NEGATIVE  NEGATIVE  Final     Studies/Results: Dg Chest 1 View  09/11/2011  *RADIOLOGY REPORT*  Clinical Data: Pain after fall; right humerus fracture  CHEST - 1 VIEW  Comparison: June 07, 2010  Findings: Cardiomegaly is unchanged.  The mediastinum and pulmonary vasculature are within normal limits.  There is no evidence of gross rib fracture or pneumothorax.  No infiltrates or effusions are identified.  IMPRESSION: Cardiomegaly, unchanged.  Original Report Authenticated By: Brandon Melnick, M.D.   Dg Pelvis 1-2 Views  09/11/2011   *RADIOLOGY REPORT*  Clinical Data: Pain after fall  PELVIS - 1-2 VIEW  Comparison: None.  Findings:  There is no evidence of pelvic fracture or dislocation. The bones are osteopenic.  Vascular calcifications are present. There are degenerative changes at the lower lumbar spine.  IMPRESSION: No evidence of pelvic fracture or dislocation.  Original Report Authenticated By: Brandon Melnick, M.D.   Dg Shoulder Right  09/11/2011  *RADIOLOGY REPORT*  Clinical Data: Fall.  Shoulder pain.  RIGHT SHOULDER - 2+ VIEW  Comparison: None.  Findings: The patient has an impacted surgical neck fracture of the right humerus with extension into the greater tuberosity. Acromioclavicular joint is intact with degenerative change noted. Imaged right lung and ribs appear normal.  IMPRESSION: Impacted surgical neck fracture of the right humerus involves the greater tuberosity.  Original Report Authenticated By: Bernadene Bell. D'ALESSIO, M.D.   Dg Forearm Right  09/11/2011  *RADIOLOGY REPORT*  Clinical Data: Pain after fall  RIGHT FOREARM - 2 VIEW  Comparison:  None  Findings: There is no evidence of fracture or dislocation.  The bones are demineralized.  There is evidence of osteoarthritis the first CMC joint.  Vascular calcifications are present.  IMPRESSION:  .No evidence of fracture or dislocation.  Original Report Authenticated By: Brandon Melnick, M.D.   Ct Head Wo Contrast  09/12/2011  *RADIOLOGY REPORT*  Clinical Data: Fall.  History of mini strokes.  CT HEAD WITHOUT CONTRAST  Technique:  Contiguous axial images were obtained from the base of the skull through the vertex without contrast.  Comparison: Head CT 11/17/2005  Findings: There is moderate periventricular patchy white matter hypoattenuation suggesting chronic microvascular ischemic changes. The ventricles are normal in size.  Slight age-related cerebral atrophy.  Negative for hemorrhage, mass effect, mass lesion, or evidence of acute cortically based infarction. The  visualized paranasal sinuses, mastoid air cells, and middle ears are clear. Thickened walls of the maxillary sinuses bilaterally, chronic, and may be due to prior infection.  IMPRESSION:  1.  No acute intracranial abnormality. 2.  Chronic extensive microvascular ischemic changes.  Original Report Authenticated By: Britta Mccreedy, M.D.   Ct Pelvis Wo Contrast  09/12/2011  *RADIOLOGY REPORT*  Clinical Data:  Bilateral hip pain.  Fall on 09/11/2011.  CT PELVIS WITHOUT CONTRAST  Technique:  Multidetector CT imaging of the pelvis was performed following the standard protocol without intravenous contrast.  Comparison:  09/11/2011  Findings:  Marked bony demineralization noted.  Abnormal anterior spur like configuration along the right sacrum is compatible with right sacral alar fracture.  There is an acute fracture of the left pubic body involving the margins of the superior and  inferior pubic rami.  There is also a nondisplaced fracture of both inferior pubic rami, and a fracture of the lateral margin of the right superior pubic ramus extending into the anterior acetabular wall.  No cortical discontinuity in the proximal femurs to suggest proximal femoral fracture.  Aortoiliac atherosclerotic calcification noted.  The right inguinal hernia contains adipose tissue.  Right gluteus medius atrophy noted mild asymmetry of the right upper psoas muscle compared to the left favors a right psoas hematoma.  There is atrophy of the right inferior rectus abdominus muscle.  Anterior subluxation of L4 on L5 by 4 mm noted, with probable fusion of the L4 and L5 facets.  There is 5 mm of anterior subluxation of L5 on S1 associated with a 50% compression fracture of L5 which is age indeterminate.  IMPRESSION:  1.  Acute fractures of the left pubic body with extension into the adjacent pubic rami, the left inferior pubic ramus, the right inferior pubic ramus, lateral portion of the right superior pubic ramus extending into the anterior  wall of the acetabulum, and of the right sacrum. 2.  Indeterminate 50% compression fracture of L5 - probably old. Lower lumbar spondylosis and lower lumbar chronic degenerative subluxations noted. 3.  Abnormal prominence the right psoas muscle compared the left, suspicious for psoas hematoma.  The superior extent of this process is not included on today's pelvis CT. 4.  Aortoiliac atherosclerotic calcification. 5.  Small right inguinal hernia contains adipose tissue. 6.  Prominent bony demineralization.  The results of this exam were flagged for direct telephone communication of report at the time of report approval. Documentation of communication is available under the workflow layer of [employer].  Original Report Authenticated By: Dellia Cloud, M.D.   Dg Humerus Right  09/11/2011  *RADIOLOGY REPORT*  Clinical Data: Pain after fall  RIGHT HUMERUS - 2+ VIEW  Comparison: None.  Findings: There is a comminuted fracture of the proximal humerus which is best seen on the shoulder films.  IMPRESSION: Comminuted fracture of the proximal right humerus.  Original Report Authenticated By: Brandon Melnick, M.D.    Medications:  Scheduled:   . bimatoprost  1 drop Both Eyes QHS  . citalopram  20 mg Oral Q breakfast  . diazepam  2 mg Oral QHS  . diltiazem  30 mg Oral Q8H  . docusate sodium  100 mg Oral TID  . donepezil  5 mg Oral QHS  . loratadine  10 mg Oral Daily  . memantine  5 mg Oral BID  . rOPINIRole  1 mg Oral QHS  . warfarin  2 mg Oral ONCE-1800  . Warfarin - Pharmacist Dosing Inpatient   Does not apply q1800  . DISCONTD: diazepam  2-4 mg Oral QHS   Continuous:   . 0.9 % NaCl with KCl 20 mEq / L 70 mL/hr at 09/13/11 0600  . DISCONTD: diltiazem (CARDIZEM) infusion 2.5 mg/hr (09/12/11 0830)   QMV:[id number], acetaminophen, bisacodyl, ondansetron (ZOFRAN) IV, ondansetron, oxyCODONE, sodium phosphate, traZODone, DISCONTD: ibuprofen  Assessment: Active Problems:  Hypertension   Hyperlipidemia  Chronic anticoagulation  Chronic kidney disease  Atrial fibrillation  Failure to thrive  Fracture of right humerus  Dementia  Multiple pelvic fractures  Psoas hematoma, left, secondary to anticoagulant therapy  Compression fracture of L5 lumbar vertebra  Thrombocytopenia   1. Acute right humeral fracture and multiple pelvic fractures, secondary to fall. Orthopedic surgeon Dr. Sanjuan Dame assessment and recommendations are noted and appreciated. The patient will need pain management  and physical therapy. The family has agreed to short-term skilled facility placement for ongoing rehabilitation.  Possible left psoas hematoma in the setting of anticoagulation and fall. This will be monitored and her hemoglobin will be monitored closely.  Chronic atrial fibrillation with RVR. Dr. Dietrich Pates 's assessment and recommendations are noted and appreciated. Her INR is therapeutic. Cardizem was started for rate control. Over the past 24 hours, her heart rate has been generally under 100 beats per minute.  Hypertension. Currently controlled.  Thrombocytopenia. This may be associated with the psoas hematoma. This will be monitored.  Plan:  1. Will decrease IV fluids a little. 2. Physical therapy consultation. 3. Continue to monitor her hemoglobin and hematocrit as well as her platelet count. 4. Skilled nursing facility placement for rehabilitation short-term.    LOS: 2 days   Darrel Baroni 09/13/2011, 8:39 AM

## 2011-09-13 NOTE — Clinical Social Work Placement (Signed)
Clinical Social Work Department CLINICAL SOCIAL WORK PLACEMENT NOTE 09/13/2011  Patient:  [name], [name]  Account Number:  [id number] Admit date:  09/11/2011  Clinical Social Worker:  Ashley Jacobs, LCSW  Date/time:  09/13/2011 01:12 PM  Clinical Social Work is seeking post-discharge placement for this patient at the following level of care:   SKILLED NURSING   (*CSW will update this form in Epic as items are completed)   09/13/2011  Patient/family provided with [facility] Department of Clinical Social Work's list of facilities offering this level of care within the geographic area requested by the patient (or if unable, by the patient's family).  09/13/2011  Patient/family informed of their freedom to choose among providers that offer the needed level of care, that participate in Medicare, Medicaid or managed care program needed by the patient, have an available bed and are willing to accept the patient.  09/13/2011  Patient/family informed of [facility]' ownership interest in [facility], as well as of the fact that they are under no obligation to receive care at this facility.  PASARR submitted to EDS on 09/12/2011 PASARR number received from EDS on 09/13/2011  FL2 transmitted to all facilities in geographic area requested by pt/family on  09/13/2011 FL2 transmitted to all facilities within larger geographic area on   Patient informed that his/her managed care company has contracts with or will negotiate with  certain facilities, including the following:     Patient/family informed of bed offers received:   Patient chooses bed at  Physician recommends and patient chooses bed at    Patient to be transferred to  on   Patient to be transferred to facility by   The following physician request were entered in Epic:   Additional Comments:  Ashley Jacobs, MSW LCSW

## 2011-09-13 NOTE — Consult Note (Signed)
ANTICOAGULATION CONSULT NOTE   Pharmacy Consult for Warfarin Indication: atrial fibrillation  No Known Allergies  Patient Measurements: Height: 4\' 8"  (142.2 cm) Weight: 118 lb 13.3 oz (53.9 kg) IBW/kg (Calculated) : 36.3   Vital Signs: Temp: 98.4 F (36.9 C) (04/19 0400) Temp src: Axillary (04/19 0400) BP: 121/59 mmHg (04/19 0904) Pulse Rate: 75  (04/19 1155)  Labs:  Basename 09/13/11 1235 09/13/11 0434 09/12/11 0426 09/12/11 0012 09/11/11 2119  HGB -- 11.3* 12.4 -- --  HCT -- 35.0* 37.5 -- 40.0  PLT -- 139* 179 -- 187  APTT -- -- -- -- --  LABPROT 22.6* -- 25.0* 23.8* --  INR 1.95* -- 2.22* 2.09* --  HEPARINUNFRC -- -- -- -- --  CREATININE -- 1.18* 1.21* -- 1.26*  CKTOTAL -- -- -- -- --  CKMB -- -- -- -- --  TROPONINI -- -- -- -- --   Estimated Creatinine Clearance: 24.7 ml/min (by C-G formula based on Cr of 1.18).  Medical History: Past Medical History  Diagnosis Date  . Hypertension     Lab 04/2011: Normal CMet except BUN of 29 and creatinine of 1.36  . Hyperlipidemia     Lipid profile in 05/2009:163, 78, 58, 89; 09/2010:179, 101, 61, 98  . Atrial fibrillation      thromboembolic CVA (right cerebral) in 10/2005  . Chronic anticoagulation      nl CBC in 04/2011  . PVD (peripheral vascular disease)     -no claudication; distal pulses are decreased  . Kyphosis   . Chronic kidney disease     -exacerbated by diuresis; creatinine of 1.8 in 6/98, 1.7 in 11/2006 and 1.36 in 3/09 & 05/2008, 1.38 in 05/2009; 1.36 in 04/2011  . Weight loss   . Hyperkalemia     5.7 in 3/09; 4.5 and 1/10  . Dyspnea   . Macular degeneration   . Osteoporosis     vertebral fracture  . TIA (transient ischemic attack)     2007  . Multiple pelvic fractures 09/13/2011  . Psoas hematoma, left, secondary to anticoagulant therapy 09/13/2011    After traumatic fall.  . Compression fracture of L5 lumbar vertebra 09/13/2011   Medications:  Prescriptions prior to admission  Medication Sig Dispense  Refill  . alendronate (FOSAMAX) 70 MG tablet Take 70 mg by mouth every 7 (seven) days. Take in the morning with a full glass of water, on an empty stomach, and do not take anything else by mouth or lie down for the next 30 min.       . bimatoprost (LUMIGAN) 0.03 % ophthalmic drops Place 1 drop into both eyes at bedtime.        . cetirizine (ZYRTEC) 10 MG tablet Take 10 mg by mouth at bedtime.       . citalopram (CELEXA) 20 MG tablet Take 20 mg by mouth every morning.       Marland Kitchen COENZYME Q-10 PO Take 10 mg by mouth daily.        . diazepam (VALIUM) 2 MG tablet Take 2-4 mg by mouth at bedtime.       . docusate sodium (COLACE) 100 MG capsule Take 100 mg by mouth 3 (three) times daily.        Marland Kitchen donepezil (ARICEPT) 5 MG tablet Take 5 mg by mouth at bedtime.      . furosemide (LASIX) 40 MG tablet Take 40 mg by mouth every morning.       Marland Kitchen ibuprofen (ADVIL,MOTRIN) 600 MG tablet  Take 600 mg by mouth as needed. For pain      . memantine (NAMENDA) 5 MG tablet Take 5 mg by mouth 2 (two) times daily. At 10am and 5 pm      . Multiple Vitamins-Minerals (ADULT ONE DAILY GUMMIES) CHEW Chew 0.5 tablets by mouth daily.      Marland Kitchen rOPINIRole (REQUIP) 1 MG tablet Take 1 mg by mouth at bedtime.        Marland Kitchen warfarin (COUMADIN) 2 MG tablet Take 3-4 mg by mouth See admin instructions. Take 3mg  (1 &1/2 tabs) on Tuesdays through Sunday. Take two tablets (4mg ) on Monday only       Assessment: INR trended down below goal  Goal of Therapy:  INR 2-3   Plan: Coumadin 4mg  today x 1 INR daily until stable  [name] A 09/13/2011,1:49 PM

## 2011-09-13 NOTE — Clinical Social Work Psychosocial (Signed)
Clinical Social Work Department BRIEF PSYCHOSOCIAL ASSESSMENT 09/13/2011  Patient:  [name], [name]     Account Number:  [id number]     Admit date:  09/11/2011  Clinical Social Worker:  Andres Shad  Date/Time:  09/13/2011 01:06 PM  Referred by:  Physician  Date Referred:  09/13/2011 Referred for  SNF Placement   Other Referral:   Interview type:  Family Other interview type:   Chart Review  Progression Meeting    PSYCHOSOCIAL DATA Living Status:  FAMILY Admitted from facility:   Level of care:   Primary support name:  daughter and son Primary support relationship to patient:  FAMILY Degree of support available:   Very supportive one is always with her at all times.    CURRENT CONCERNS Current Concerns  Post-Acute Placement   Other Concerns:    SOCIAL WORK ASSESSMENT / PLAN Received referral for very SHORT term placement in SNF. Family reports they really want to take her home because they have been her caregivers for many years, however due to the recent fractures and medical needs, patient is requiring more than they can manage, thus would like placement for atleast three weeks then return home with home health.  Process was explained to family including the difference between ALF and SNF.  SNF was understood as best placement at this time.  With permission information was faxed out and a list of numbers for SNF were given to daughter   Assessment/plan status:  Psychosocial Support/Ongoing Assessment of Needs Other assessment/ plan:   Information/referral to community resources:   Referral to [facility]    PATIENT'S/FAMILY'S RESPONSE TO PLAN OF CARE: All agreeable to SNF placement with first choice being [facility] and second being [facility].  Will follow up.  Naara Kelty Nail, MSW LCSW

## 2011-09-14 LAB — CBC
HCT: 35.1 % — ABNORMAL LOW (ref 36.0–46.0)
Hemoglobin: 11.2 g/dL — ABNORMAL LOW (ref 12.0–15.0)
MCH: 30.7 pg (ref 26.0–34.0)
MCV: 96.2 fL (ref 78.0–100.0)
RBC: 3.65 MIL/uL — ABNORMAL LOW (ref 3.87–5.11)

## 2011-09-14 LAB — PROTIME-INR: Prothrombin Time: 22.3 seconds — ABNORMAL HIGH (ref 11.6–15.2)

## 2011-09-14 MED ORDER — SENNA 8.6 MG PO TABS
1.0000 | ORAL_TABLET | Freq: Every day | ORAL | Status: DC
  Administered 2011-09-14 – 2011-09-16 (×3): 8.6 mg via ORAL
  Filled 2011-09-14 (×3): qty 1

## 2011-09-14 MED ORDER — WARFARIN SODIUM 5 MG PO TABS
5.0000 mg | ORAL_TABLET | Freq: Once | ORAL | Status: AC
  Administered 2011-09-14: 5 mg via ORAL
  Filled 2011-09-14: qty 1

## 2011-09-14 NOTE — Consult Note (Signed)
ANTICOAGULATION CONSULT NOTE   Pharmacy Consult for Warfarin Indication: atrial fibrillation  No Known Allergies  Patient Measurements: Height: 4\' 8"  (142.2 cm) Weight: 118 lb 13.3 oz (53.9 kg) IBW/kg (Calculated) : 36.3   Vital Signs: Temp: 98 F (36.7 C) (04/20 0510) Temp src: Oral (04/20 0510) BP: 114/62 mmHg (04/20 0510) Pulse Rate: 78  (04/20 0510)  Labs:  Basename 09/14/11 0710 09/13/11 1235 09/13/11 0434 09/12/11 0426 09/11/11 2119  HGB 11.2* -- 11.3* -- --  HCT 35.1* -- 35.0* 37.5 --  PLT 140* -- 139* 179 --  APTT -- -- -- -- --  LABPROT 22.3* 22.6* -- 25.0* --  INR 1.92* 1.95* -- 2.22* --  HEPARINUNFRC -- -- -- -- --  CREATININE -- -- 1.18* 1.21* 1.26*  CKTOTAL -- -- -- -- --  CKMB -- -- -- -- --  TROPONINI -- -- -- -- --   Estimated Creatinine Clearance: 24.7 ml/min (by C-G formula based on Cr of 1.18).  Medical History: Past Medical History  Diagnosis Date  . Hypertension     Lab 04/2011: Normal CMet except BUN of 29 and creatinine of 1.36  . Hyperlipidemia     Lipid profile in 05/2009:163, 78, 58, 89; 09/2010:179, 101, 61, 98  . Atrial fibrillation      thromboembolic CVA (right cerebral) in 10/2005  . Chronic anticoagulation      nl CBC in 04/2011  . PVD (peripheral vascular disease)     -no claudication; distal pulses are decreased  . Kyphosis   . Chronic kidney disease     -exacerbated by diuresis; creatinine of 1.8 in 6/98, 1.7 in 11/2006 and 1.36 in 3/09 & 05/2008, 1.38 in 05/2009; 1.36 in 04/2011  . Weight loss   . Hyperkalemia     5.7 in 3/09; 4.5 and 1/10  . Dyspnea   . Macular degeneration   . Osteoporosis     vertebral fracture  . TIA (transient ischemic attack)     2007  . Multiple pelvic fractures 09/13/2011  . Psoas hematoma, left, secondary to anticoagulant therapy 09/13/2011    After traumatic fall.  . Compression fracture of L5 lumbar vertebra 09/13/2011   Medications:  Prescriptions prior to admission  Medication Sig Dispense  Refill  . alendronate (FOSAMAX) 70 MG tablet Take 70 mg by mouth every 7 (seven) days. Take in the morning with a full glass of water, on an empty stomach, and do not take anything else by mouth or lie down for the next 30 min.       . bimatoprost (LUMIGAN) 0.03 % ophthalmic drops Place 1 drop into both eyes at bedtime.        . cetirizine (ZYRTEC) 10 MG tablet Take 10 mg by mouth at bedtime.       . citalopram (CELEXA) 20 MG tablet Take 20 mg by mouth every morning.       Marland Kitchen COENZYME Q-10 PO Take 10 mg by mouth daily.        . diazepam (VALIUM) 2 MG tablet Take 2-4 mg by mouth at bedtime.       . docusate sodium (COLACE) 100 MG capsule Take 100 mg by mouth 3 (three) times daily.        Marland Kitchen donepezil (ARICEPT) 5 MG tablet Take 5 mg by mouth at bedtime.      . furosemide (LASIX) 40 MG tablet Take 40 mg by mouth every morning.       Marland Kitchen ibuprofen (ADVIL,MOTRIN) 600 MG tablet  Take 600 mg by mouth as needed. For pain      . memantine (NAMENDA) 5 MG tablet Take 5 mg by mouth 2 (two) times daily. At 10am and 5 pm      . Multiple Vitamins-Minerals (ADULT ONE DAILY GUMMIES) CHEW Chew 0.5 tablets by mouth daily.      Marland Kitchen rOPINIRole (REQUIP) 1 MG tablet Take 1 mg by mouth at bedtime.        Marland Kitchen warfarin (COUMADIN) 2 MG tablet Take 3-4 mg by mouth See admin instructions. Take 3mg  (1 &1/2 tabs) on Tuesdays through Sunday. Take two tablets (4mg ) on Monday only       Assessment: INR trended down below goal  Goal of Therapy:  INR 2-3   Plan: Coumadin 5 mg today x 1 INR daily until stable  Raquel James, Dodie Parisi Bennett 09/14/2011,8:26 AM

## 2011-09-14 NOTE — Progress Notes (Signed)
W/E Clinical Social Worker (CSW) visited pt room and met with pt daughter [name] [phone]) to provide bed offers. Currently pt has offers at 3 facilities however pt daughter preferred choice ([facility] or [facility]) have not offered placement. CSW contacted both facilities who informed CSW that they do not have an admissions person today and therefore weekday CSW will need to follow up on Monday whether either can offer placement. CSW informed [name] of this information. [name] asked that weekday CSW follow up with her or her brother Trey Paula [phone]) regarding whether either [facility] or [facility] are able to accept pt.  Theresia Bough, MSW, Theresia Majors [phone]

## 2011-09-14 NOTE — Progress Notes (Signed)
Subjective: I am fine.   Objective: Vital signs in last 24 hours: Temp:  [98 F (36.7 C)-98.5 F (36.9 C)] 98 F (36.7 C) (04/20 1026) Pulse Rate:  [50-145] 95  (04/20 1026) Resp:  [14-20] 17  (04/20 1026) BP: (112-127)/(54-80) 119/78 mmHg (04/20 1026) SpO2:  [83 %-97 %] 94 % (04/20 1026)  Intake/Output from previous day: 04/19 0701 - 04/20 0700 In: 1070 [P.O.:600; I.V.:470] Out: -  Intake/Output this shift: Total I/O In: 120 [P.O.:120] Out: -    Basename 09/14/11 0710 09/13/11 0434 09/12/11 0426 09/11/11 2119  HGB 11.2* 11.3* 12.4 13.3    Basename 09/14/11 0710 09/13/11 0434  WBC 7.0 7.5  RBC 3.65* 3.70*  HCT 35.1* 35.0*  PLT 140* 139*    Basename 09/13/11 0434 09/12/11 0426  NA 136 139  K 4.5 4.1  CL 106 104  CO2 23 25  BUN 24* 24*  CREATININE 1.18* 1.21*  GLUCOSE 105* 152*  CALCIUM 8.5 8.8    Basename 09/14/11 0710 09/13/11 1235  LABPT -- --  INR 1.92* 1.95*    Neurovascular intact Sensation intact distally Intact pulses distally  Assessment/Plan: Fracture of the proximal humerus on the right and the pelvis.  Doing well.  Continue PT, Sling.   [name] 09/14/2011, 10:44 AM

## 2011-09-14 NOTE — Progress Notes (Addendum)
Subjective: The patient has no new complaints. Her daughter is in her room. Questions answered.  Objective: Vital signs in last 24 hours: Filed Vitals:   09/13/11 2046 09/13/11 2058 09/14/11 0510 09/14/11 1026  BP:  121/80 114/62 119/78  Pulse:  102 78 95  Temp:  98.5 F (36.9 C) 98 F (36.7 C) 98 F (36.7 C)  TempSrc:  Oral Oral Oral  Resp:  20 18 17   Height:      Weight:      SpO2: 88% 88% 95% 94%    Intake/Output Summary (Last 24 hours) at 09/14/11 1304 Last data filed at 09/14/11 0845  Gross per 24 hour  Intake    670 ml  Output      0 ml  Net    670 ml    Weight change:   Physical exam: Gen.: Elderly 78 year old Caucasian woman lying in bed, in no acute distress. Heart: Irregular, irregular. Abdomen: Positive bowel sounds, soft, mildly tender over the left greater than the right pelvis, and no masses palpated, no rigidity or distention. Extremities: Edematous and tender right arm. She is able to wiggle the fingers on her left hand. No pedal edema.  Lab Results: Basic Metabolic Panel:  Basename 09/13/11 0434 09/12/11 0426  NA 136 139  K 4.5 4.1  CL 106 104  CO2 23 25  GLUCOSE 105* 152*  BUN 24* 24*  CREATININE 1.18* 1.21*  CALCIUM 8.5 8.8  MG -- 2.0  PHOS -- --   Liver Function Tests:  Basename 09/12/11 0426  AST 30  ALT 20  ALKPHOS 62  BILITOT 0.5  PROT 6.4  ALBUMIN 3.0*   No results found for this basename: LIPASE:2,AMYLASE:2 in the last 72 hours No results found for this basename: AMMONIA:2 in the last 72 hours CBC:  Basename 09/14/11 0710 09/13/11 0434 09/11/11 2119  WBC 7.0 7.5 --  NEUTROABS -- -- 9.5*  HGB 11.2* 11.3* --  HCT 35.1* 35.0* --  MCV 96.2 94.6 --  PLT 140* 139* --   Cardiac Enzymes: No results found for this basename: CKTOTAL:3,CKMB:3,CKMBINDEX:3,TROPONINI:3 in the last 72 hours BNP: No results found for this basename: PROBNP:3 in the last 72 hours D-Dimer: No results found for this basename: DDIMER:2 in the last 72  hours CBG:  Basename 09/12/11 0751  GLUCAP 129*   Hemoglobin A1C: No results found for this basename: HGBA1C in the last 72 hours Fasting Lipid Panel: No results found for this basename: CHOL,HDL,LDLCALC,TRIG,CHOLHDL,LDLDIRECT in the last 72 hours Thyroid Function Tests:  Basename 09/12/11 0944 09/12/11 0426  TSH -- 3.348  T4TOTAL -- --  FREET4 1.02 --  T3FREE -- --  Marko Plume -- --   Anemia Panel:  Basename 09/12/11 0944  VITAMINB12 455  FOLATE --  FERRITIN --  TIBC --  IRON --  RETICCTPCT --   Coagulation:  Basename 09/14/11 0710 09/13/11 1235  LABPROT 22.3* 22.6*  INR 1.92* 1.95*   Urine Drug Screen: Drugs of Abuse  No results found for this basename: labopia,  cocainscrnur,  labbenz,  amphetmu,  thcu,  labbarb    Alcohol Level: No results found for this basename: ETH:2 in the last 72 hours Urinalysis:  Basename 09/11/11 2214  COLORURINE YELLOW  LABSPEC 1.015  PHURINE 6.5  GLUCOSEU NEGATIVE  HGBUR TRACE*  BILIRUBINUR NEGATIVE  KETONESUR NEGATIVE  PROTEINUR NEGATIVE  UROBILINOGEN 1.0  NITRITE NEGATIVE  LEUKOCYTESUR NEGATIVE   Misc. Labs:   Micro: Recent Results (from the past 240 hour(s))  MRSA PCR  SCREENING     Status: Normal   Collection Time   09/12/11  3:41 AM      Component Value Range Status Comment   MRSA by PCR NEGATIVE  NEGATIVE  Final     Studies/Results: No results found.  Medications:  Scheduled:    . bimatoprost  1 drop Both Eyes QHS  . citalopram  20 mg Oral Q breakfast  . diazepam  2 mg Oral QHS  . diltiazem  30 mg Oral Q8H  . docusate sodium  100 mg Oral TID  . donepezil  5 mg Oral QHS  . loratadine  10 mg Oral Daily  . memantine  5 mg Oral BID  . oxyCODONE      . rOPINIRole  1 mg Oral QHS  . sodium chloride      . warfarin  4 mg Oral ONCE-1800  . warfarin  5 mg Oral ONCE-1800  . Warfarin - Pharmacist Dosing Inpatient   Does not apply q1800   Continuous:    . 0.9 % NaCl with KCl 20 mEq / L 50 mL/hr at  09/13/11 1800   ZOX:[id number], acetaminophen, bisacodyl, ondansetron (ZOFRAN) IV, ondansetron, oxyCODONE, traZODone  Assessment: Active Problems:  Hypertension  Hyperlipidemia  Chronic anticoagulation  Chronic kidney disease  Atrial fibrillation  Failure to thrive  Fracture of right humerus  Dementia  Multiple pelvic fractures  Psoas hematoma, left, secondary to anticoagulant therapy  Compression fracture of L5 lumbar vertebra  Thrombocytopenia   1. Acute right humeral fracture and multiple pelvic fractures, secondary to fall. Orthopedic surgeon Dr. Sanjuan Dame assessment and recommendations are noted and appreciated. The patient will need pain management and physical therapy. The family has agreed to short-term skilled facility placement for ongoing rehabilitation.  Possible right psoas hematoma in the setting of anticoagulation and fall. This will be monitored and her hemoglobin will be monitored closely.  Chronic atrial fibrillation with RVR. Dr. Dietrich Pates 's assessment and recommendations are noted and appreciated. Her INR is therapeutic. Cardizem was started for rate control.   Hypertension. Currently controlled.  Thrombocytopenia. This may be associated with the psoas hematoma. This will be monitored.  Plan:  1. Skilled nursing facility placement for rehabilitation short-term.    LOS: 3 days   Trica Usery 09/14/2011, 1:04 PM

## 2011-09-14 NOTE — Progress Notes (Signed)
Physical Therapy Treatment Patient Details Name: [name] MRN: [id number] DOB: 16-Feb-1926 Today's Date: 09/14/2011 Time: 1625-     PT Assessment / Plan / Recommendation Comments on Treatment Session  Pt was participative and tolerated given bed exercises. Patient requires increased assistance for bed mobility and requires Max A with +1 assist to transfer from bed to chair. Pt's RUE intact on arm sling during transfers using SPT. Patient may benefit from SNF placement to better improve functional mobility to have safe return to PLOF. Nurse was made aware of pt's transfer status and advised two-person assist and use safety belt.      Follow Up Recommendations  Skilled nursing facility    Equipment Recommendations  Defer to next venue    Frequency Min 5X/week   Plan      Precautions / Restrictions Precautions Precautions: Fall;Shoulder Type of Shoulder Precautions: R shoulder fx- in sling- no weight bearing Restrictions Weight Bearing Restrictions: No       Mobility  Bed Mobility Supine to Sit: 2: Max assist;1: +1 Total assist Sitting - Scoot to Edge of Bed: 2: Max assist;1: +1 Total assist Transfers Transfers: Sit to Stand;Stand to Sit;Stand Pivot Transfers Sit to Stand: 2: Max assist;From elevated surface;From bed;1: +1 Total assist Stand to Sit: 2: Max assist;1: +1 Total assist;To chair/3-in-1 Ambulation/Gait Ambulation/Gait Assistance: Not tested (comment)    Exercises Total Joint Exercises Ankle Circles/Pumps: AAROM;20 reps;Supine Short Arc Quad: AAROM;10 reps;Supine Knee Flexion: AAROM;20 reps;Supine   PT Goals Acute Rehab PT Goals PT Goal Formulation: With patient/family Pt will go Supine/Side to Sit: with mod assist Pt will go Sit to Stand: with mod assist Pt will go Stand to Sit: with mod assist Pt will Transfer Bed to Chair/Chair to Bed: with mod assist  Visit Information  Last PT Received On: 09/14/11    Subjective Data  Subjective: Pt c/o  minimal pain on R elbow and shoulder at rest and upon movement    Cognition  Overall Cognitive Status: Appears within functional limits for tasks assessed/performed Arousal/Alertness: Awake/alert Orientation Level: Appears intact for tasks assessed Behavior During Session: [facility] for tasks performed    Balance     End of Session PT - End of Session Equipment Utilized During Treatment: Gait belt Activity Tolerance: Patient tolerated treatment well Patient left: in chair;with family/visitor present;Other (comment) (with call bell/phone within reach) Nurse Communication: Mobility status    [name], [name] 09/14/2011, 5:14 PM

## 2011-09-15 LAB — PROTIME-INR: INR: 2.53 — ABNORMAL HIGH (ref 0.00–1.49)

## 2011-09-15 MED ORDER — SENNA 8.6 MG PO TABS: 1.0000 | ORAL_TABLET | Freq: Every day | ORAL | Status: AC

## 2011-09-15 MED ORDER — OXYCODONE HCL 5 MG PO TABS: 5.0000 mg | ORAL_TABLET | ORAL | Status: AC | PRN

## 2011-09-15 MED ORDER — DILTIAZEM HCL ER COATED BEADS 120 MG PO CP24
120.0000 mg | ORAL_CAPSULE | Freq: Every day | ORAL | Status: DC
Start: 2011-09-15 — End: 2011-09-17
  Administered 2011-09-15 – 2011-09-17 (×3): 120 mg via ORAL
  Filled 2011-09-15 (×3): qty 1

## 2011-09-15 MED ORDER — DILTIAZEM HCL ER COATED BEADS 120 MG PO CP24: 120.0000 mg | ORAL_CAPSULE | Freq: Every day | ORAL | Status: AC

## 2011-09-15 MED ORDER — FUROSEMIDE 40 MG PO TABS
40.0000 mg | ORAL_TABLET | Freq: Every day | ORAL | Status: DC
  Administered 2011-09-16 – 2011-09-17 (×2): 40 mg via ORAL
  Filled 2011-09-15 (×2): qty 1

## 2011-09-15 MED ORDER — ROPINIROLE HCL 1 MG PO TABS
ORAL_TABLET | ORAL | Status: AC
  Filled 2011-09-15: qty 1

## 2011-09-15 MED ORDER — POTASSIUM CHLORIDE CRYS ER 10 MEQ PO TBCR: 10.0000 meq | EXTENDED_RELEASE_TABLET | Freq: Every day | ORAL | Status: AC

## 2011-09-15 MED ORDER — POTASSIUM CHLORIDE CRYS ER 10 MEQ PO TBCR
10.0000 meq | EXTENDED_RELEASE_TABLET | Freq: Every day | ORAL | Status: AC
  Administered 2011-09-16: 10 meq via ORAL
  Filled 2011-09-15: qty 1

## 2011-09-15 MED ORDER — WARFARIN SODIUM 1 MG PO TABS
1.0000 mg | ORAL_TABLET | Freq: Once | ORAL | Status: AC
  Administered 2011-09-15: 1 mg via ORAL
  Filled 2011-09-15: qty 1

## 2011-09-15 MED ORDER — DIAZEPAM 2 MG PO TABS: 2.0000 mg | ORAL_TABLET | Freq: Every day | ORAL | Status: AC

## 2011-09-15 MED ORDER — FLEET ENEMA 7-19 GM/118ML RE ENEM
1.0000 | ENEMA | Freq: Every day | RECTAL | Status: DC | PRN
  Administered 2011-09-15: 1 via RECTAL

## 2011-09-15 NOTE — Consult Note (Signed)
ANTICOAGULATION CONSULT NOTE   Pharmacy Consult for Warfarin Indication: atrial fibrillation  No Known Allergies  Patient Measurements: Height: 4\' 8"  (142.2 cm) Weight: 124 lb 9 oz (56.5 kg) IBW/kg (Calculated) : 36.3   Vital Signs: Temp: 98.1 F (36.7 C) (04/21 0504) Temp src: Oral (04/21 0504) BP: 144/65 mmHg (04/21 0504) Pulse Rate: 60  (04/21 0504)  Labs:  Basename 09/15/11 0425 09/14/11 0710 09/13/11 1235 09/13/11 0434  HGB -- 11.2* -- 11.3*  HCT -- 35.1* -- 35.0*  PLT -- 140* -- 139*  APTT -- -- -- --  LABPROT 27.7* 22.3* 22.6* --  INR 2.53* 1.92* 1.95* --  HEPARINUNFRC -- -- -- --  CREATININE -- -- -- 1.18*  CKTOTAL -- -- -- --  CKMB -- -- -- --  TROPONINI -- -- -- --   Estimated Creatinine Clearance: 25.3 ml/min (by C-G formula based on Cr of 1.18).  Medical History: Past Medical History  Diagnosis Date  . Hypertension     Lab 04/2011: Normal CMet except BUN of 29 and creatinine of 1.36  . Hyperlipidemia     Lipid profile in 05/2009:163, 78, 58, 89; 09/2010:179, 101, 61, 98  . Atrial fibrillation      thromboembolic CVA (right cerebral) in 10/2005  . Chronic anticoagulation      nl CBC in 04/2011  . PVD (peripheral vascular disease)     -no claudication; distal pulses are decreased  . Kyphosis   . Chronic kidney disease     -exacerbated by diuresis; creatinine of 1.8 in 6/98, 1.7 in 11/2006 and 1.36 in 3/09 & 05/2008, 1.38 in 05/2009; 1.36 in 04/2011  . Weight loss   . Hyperkalemia     5.7 in 3/09; 4.5 and 1/10  . Dyspnea   . Macular degeneration   . Osteoporosis     vertebral fracture  . TIA (transient ischemic attack)     2007  . Multiple pelvic fractures 09/13/2011  . Psoas hematoma, left, secondary to anticoagulant therapy 09/13/2011    After traumatic fall.  . Compression fracture of L5 lumbar vertebra 09/13/2011   Medications:  Prescriptions prior to admission  Medication Sig Dispense Refill  . alendronate (FOSAMAX) 70 MG tablet Take 70 mg by  mouth every 7 (seven) days. Take in the morning with a full glass of water, on an empty stomach, and do not take anything else by mouth or lie down for the next 30 min.       . bimatoprost (LUMIGAN) 0.03 % ophthalmic drops Place 1 drop into both eyes at bedtime.        . cetirizine (ZYRTEC) 10 MG tablet Take 10 mg by mouth at bedtime.       . citalopram (CELEXA) 20 MG tablet Take 20 mg by mouth every morning.       Marland Kitchen COENZYME Q-10 PO Take 10 mg by mouth daily.        . diazepam (VALIUM) 2 MG tablet Take 2-4 mg by mouth at bedtime.       . docusate sodium (COLACE) 100 MG capsule Take 100 mg by mouth 3 (three) times daily.        Marland Kitchen donepezil (ARICEPT) 5 MG tablet Take 5 mg by mouth at bedtime.      . furosemide (LASIX) 40 MG tablet Take 40 mg by mouth every morning.       Marland Kitchen ibuprofen (ADVIL,MOTRIN) 600 MG tablet Take 600 mg by mouth as needed. For pain      .  memantine (NAMENDA) 5 MG tablet Take 5 mg by mouth 2 (two) times daily. At 10am and 5 pm      . Multiple Vitamins-Minerals (ADULT ONE DAILY GUMMIES) CHEW Chew 0.5 tablets by mouth daily.      Marland Kitchen rOPINIRole (REQUIP) 1 MG tablet Take 1 mg by mouth at bedtime.        Marland Kitchen warfarin (COUMADIN) 2 MG tablet Take 3-4 mg by mouth See admin instructions. Take 3mg  (1 &1/2 tabs) on Tuesdays through Sunday. Take two tablets (4mg ) on Monday only       Assessment: INR therapeutic Noted large increase INR over last 2 days  Goal of Therapy:  INR 2-3   Plan: Coumadin 1 mg today x 1 INR daily until stable  [name] 09/15/2011,9:31 AM

## 2011-09-15 NOTE — Progress Notes (Signed)
Physical Therapy Treatment Patient Details Name: [name] MRN: [id number] DOB: [birth year] Today's Date: 09/15/2011 Time: 4098-1191 PT Time Calculation (min): 40 min  PT Assessment / Plan / Recommendation Comments on Treatment Session  Pt was able to perform therapeutic exercises much better today with less/minimal c/o pain on R shoulder. Pt was very cooperative and motivated to get better. Pt performed ex while in bed and seated on EOB with assist. Performed sit to stand activties given Max-Mod+ A with walker x 4times. Pt's family aware of probable d/c of pt to SNF for therapy before return to home.      Follow Up Recommendations  Skilled nursing facility    Equipment Recommendations  Defer to next venue    Frequency 7X/week   Plan Discharge plan remains appropriate    Precautions / Restrictions Precautions Precautions: Fall;Shoulder Type of Shoulder Precautions: R shoulder fx- in sling- no weight bearing  Restrictions Weight Bearing Restrictions: No        Mobility  Bed Mobility Supine to Sit: 2: Max assist Sitting - Scoot to Edge of Bed: 3: Mod assist Transfers Transfers: Sit to Stand;Stand to Sit Sit to Stand: 2: Max assist Stand to Sit: 3: Mod assist Ambulation/Gait Stairs: No Wheelchair Mobility Wheelchair Mobility: No    Exercises Total Joint Exercises Ankle Circles/Pumps: AAROM;20 reps;Supine;Both Short Arc Quad: AAROM;10 reps;Both Hip ABduction/ADduction: AAROM;20 reps;Supine Long Arc Quad: AROM;20 reps;Seated Knee Flexion: 20 reps;Supine;AAROM General Exercises - Upper Extremity Shoulder Flexion: AAROM;Left;10 reps;Supine Shoulder ABduction: AAROM;Left;10 reps;Supine Shoulder ADduction: AAROM;Left;10 reps;Supine Shoulder Horizontal ABduction: AAROM;10 reps;Supine Shoulder Horizontal ADduction: AAROM;10 reps;Supine Elbow Flexion: AAROM;10 reps;Supine Elbow Extension: AAROM;10 reps;Supine   PT Goals Acute Rehab PT Goals PT Goal Formulation:  With patient/family Potential to Achieve Goals: Good Pt will go Supine/Side to Sit: with mod assist Pt will go Sit to Supine/Side: with mod assist Pt will go Sit to Stand: with mod assist Pt will go Stand to Sit: with min assist Pt will Transfer Bed to Chair/Chair to Bed: with mod assist  Visit Information  Last PT Received On: 09/15/11    Subjective Data  Subjective: Patient c/o minimal to no pain to R shoulder at rest Patient Stated Goal: Pt/family want to go home after therapy from SNF   Cognition  Overall Cognitive Status: Appears within functional limits for tasks assessed/performed Arousal/Alertness: Awake/alert Orientation Level: Situation;Person Behavior During Session: [facility] for tasks performed    Balance     End of Session PT - End of Session Equipment Utilized During Treatment: Gait belt Activity Tolerance: Patient tolerated treatment well Patient left: in bed;with call bell/phone within reach;with family/visitor present    [name], [name] 09/15/2011, 11:35 AM

## 2011-09-15 NOTE — Discharge Summary (Signed)
Physician Discharge Summary  [name] MRN: [id number] DOB/AGE: January 12, 1926 78 y.o.  PCP: Colette Ribas, MD, MD   Admit date: 09/11/2011 Discharge date: 09/16/2011  Discharge Diagnoses:  1. Right humerus fracture and multiple fractures of the pelvis, status post fall prior to hospitalization. 2. Age indeterminate L5 compression vertebral fracture. 3. Possible psoas hematoma in the setting of chronic anticoagulation. 4. Chronic atrial fibrillation with rapid ventricular response. 5. Chronic warfarin coagulation. 6. Mild thrombocytopenia. 7. Mild anemia. 8. Hypertension. 9. Stage 2-3 chronic kidney disease. 10. Hyperlipidemia. 11. Chronic dementia. 12. Hyperlipidemia.   Medication List  As of 09/15/2011  3:37 PM   STOP taking these medications         ibuprofen 600 MG tablet         TAKE these medications         ADULT ONE DAILY GUMMIES Chew   Chew 0.5 tablets by mouth daily.      alendronate 70 MG tablet   Commonly known as: FOSAMAX   Take 70 mg by mouth every 7 (seven) days. Take in the morning with a full glass of water, on an empty stomach, and do not take anything else by mouth or lie down for the next 30 min.      cetirizine 10 MG tablet   Commonly known as: ZYRTEC   Take 10 mg by mouth at bedtime.      citalopram 20 MG tablet   Commonly known as: CELEXA   Take 20 mg by mouth every morning.      COENZYME Q-10 PO   Take 10 mg by mouth daily.      diazepam 2 MG tablet   Commonly known as: VALIUM   Take 1-2 tablets (2-4 mg total) by mouth at bedtime.      diltiazem 120 MG 24 hr capsule   Commonly known as: CARDIZEM CD   Take 1 capsule (120 mg total) by mouth daily.      docusate sodium 100 MG capsule   Commonly known as: COLACE   Take 100 mg by mouth 3 (three) times daily.      donepezil 5 MG tablet   Commonly known as: ARICEPT   Take 5 mg by mouth at bedtime.      furosemide 40 MG tablet   Commonly known as: LASIX   Take 40 mg  by mouth every morning.      LUMIGAN 0.03 % ophthalmic solution   Generic drug: bimatoprost   Place 1 drop into both eyes at bedtime.      memantine 5 MG tablet   Commonly known as: NAMENDA   Take 5 mg by mouth 2 (two) times daily. At 10am and 5 pm      oxyCODONE 5 MG immediate release tablet   Commonly known as: Oxy IR/ROXICODONE   Take 1 tablet (5 mg total) by mouth every 4 (four) hours as needed.      potassium chloride 10 MEQ tablet   Commonly known as: K-DUR,KLOR-CON   Take 1 tablet (10 mEq total) by mouth daily. Give with Lasix.      rOPINIRole 1 MG tablet   Commonly known as: REQUIP   Take 1 mg by mouth at bedtime.      senna 8.6 MG Tabs   Commonly known as: SENOKOT   Take 1 tablet (8.6 mg total) by mouth at bedtime.      warfarin 2 MG tablet   Commonly known as: COUMADIN  Take 3-4 mg by mouth See admin instructions. Take 3mg  (1 &1/2 tabs) on Tuesdays through Sunday. Take two tablets (4mg ) on Monday only            Discharge Condition: Currently stable.  Disposition:  Skilled nursing facility when bed available.   Consults: Beverely Risen., M.D. and Darreld Mclean, M.D.   Significant Diagnostic Studies: Dg Chest 1 View  09/11/2011  *RADIOLOGY REPORT*  Clinical Data: Pain after fall; right humerus fracture  CHEST - 1 VIEW  Comparison: June 07, 2010  Findings: Cardiomegaly is unchanged.  The mediastinum and pulmonary vasculature are within normal limits.  There is no evidence of gross rib fracture or pneumothorax.  No infiltrates or effusions are identified.  IMPRESSION: Cardiomegaly, unchanged.  Original Report Authenticated By: Brandon Melnick, M.D.   Dg Pelvis 1-2 Views  09/11/2011  *RADIOLOGY REPORT*  Clinical Data: Pain after fall  PELVIS - 1-2 VIEW  Comparison: None.  Findings:  There is no evidence of pelvic fracture or dislocation. The bones are osteopenic.  Vascular calcifications are present. There are degenerative changes at the lower lumbar spine.   IMPRESSION: No evidence of pelvic fracture or dislocation.  Original Report Authenticated By: Brandon Melnick, M.D.   Dg Shoulder Right  09/11/2011  *RADIOLOGY REPORT*  Clinical Data: Fall.  Shoulder pain.  RIGHT SHOULDER - 2+ VIEW  Comparison: None.  Findings: The patient has an impacted surgical neck fracture of the right humerus with extension into the greater tuberosity. Acromioclavicular joint is intact with degenerative change noted. Imaged right lung and ribs appear normal.  IMPRESSION: Impacted surgical neck fracture of the right humerus involves the greater tuberosity.  Original Report Authenticated By: Bernadene Bell. D'ALESSIO, M.D.   Dg Forearm Right  09/11/2011  *RADIOLOGY REPORT*  Clinical Data: Pain after fall  RIGHT FOREARM - 2 VIEW  Comparison:  None  Findings: There is no evidence of fracture or dislocation.  The bones are demineralized.  There is evidence of osteoarthritis the first CMC joint.  Vascular calcifications are present.  IMPRESSION:  .No evidence of fracture or dislocation.  Original Report Authenticated By: Brandon Melnick, M.D.   Ct Head Wo Contrast  09/12/2011  *RADIOLOGY REPORT*  Clinical Data: Fall.  History of mini strokes.  CT HEAD WITHOUT CONTRAST  Technique:  Contiguous axial images were obtained from the base of the skull through the vertex without contrast.  Comparison: Head CT 11/17/2005  Findings: There is moderate periventricular patchy white matter hypoattenuation suggesting chronic microvascular ischemic changes. The ventricles are normal in size.  Slight age-related cerebral atrophy.  Negative for hemorrhage, mass effect, mass lesion, or evidence of acute cortically based infarction. The visualized paranasal sinuses, mastoid air cells, and middle ears are clear. Thickened walls of the maxillary sinuses bilaterally, chronic, and may be due to prior infection.  IMPRESSION:  1.  No acute intracranial abnormality. 2.  Chronic extensive microvascular ischemic changes.   Original Report Authenticated By: Britta Mccreedy, M.D.   Ct Pelvis Wo Contrast  09/12/2011  *RADIOLOGY REPORT*  Clinical Data:  Bilateral hip pain.  Fall on 09/11/2011.  CT PELVIS WITHOUT CONTRAST  Technique:  Multidetector CT imaging of the pelvis was performed following the standard protocol without intravenous contrast.  Comparison:  09/11/2011  Findings:  Marked bony demineralization noted.  Abnormal anterior spur like configuration along the right sacrum is compatible with right sacral alar fracture.  There is an acute fracture of the left pubic body involving the margins of  the superior and inferior pubic rami.  There is also a nondisplaced fracture of both inferior pubic rami, and a fracture of the lateral margin of the right superior pubic ramus extending into the anterior acetabular wall.  No cortical discontinuity in the proximal femurs to suggest proximal femoral fracture.  Aortoiliac atherosclerotic calcification noted.  The right inguinal hernia contains adipose tissue.  Right gluteus medius atrophy noted mild asymmetry of the right upper psoas muscle compared to the left favors a right psoas hematoma.  There is atrophy of the right inferior rectus abdominus muscle.  Anterior subluxation of L4 on L5 by 4 mm noted, with probable fusion of the L4 and L5 facets.  There is 5 mm of anterior subluxation of L5 on S1 associated with a 50% compression fracture of L5 which is age indeterminate.  IMPRESSION:  1.  Acute fractures of the left pubic body with extension into the adjacent pubic rami, the left inferior pubic ramus, the right inferior pubic ramus, lateral portion of the right superior pubic ramus extending into the anterior wall of the acetabulum, and of the right sacrum. 2.  Indeterminate 50% compression fracture of L5 - probably old. Lower lumbar spondylosis and lower lumbar chronic degenerative subluxations noted. 3.  Abnormal prominence the right psoas muscle compared the left, suspicious for psoas  hematoma.  The superior extent of this process is not included on today's pelvis CT. 4.  Aortoiliac atherosclerotic calcification. 5.  Small right inguinal hernia contains adipose tissue. 6.  Prominent bony demineralization.  The results of this exam were flagged for direct telephone communication of report at the time of report approval. Documentation of communication is available under the workflow layer of [employer].  Original Report Authenticated By: Dellia Cloud, M.D.   Dg Humerus Right  09/11/2011  *RADIOLOGY REPORT*  Clinical Data: Pain after fall  RIGHT HUMERUS - 2+ VIEW  Comparison: None.  Findings: There is a comminuted fracture of the proximal humerus which is best seen on the shoulder films.  IMPRESSION: Comminuted fracture of the proximal right humerus.  Original Report Authenticated By: Brandon Melnick, M.D.     Microbiology: Recent Results (from the past 240 hour(s))  MRSA PCR SCREENING     Status: Normal   Collection Time   09/12/11  3:41 AM      Component Value Range Status Comment   MRSA by PCR NEGATIVE  NEGATIVE  Final      Labs: Results for orders placed during the hospital encounter of 09/11/11 (from the past 48 hour(s))  CBC     Status: Abnormal   Collection Time   09/14/11  7:10 AM      Component Value Range Comment   WBC 7.0  4.0 - 10.5 (K/uL)    RBC 3.65 (*) 3.87 - 5.11 (MIL/uL)    Hemoglobin 11.2 (*) 12.0 - 15.0 (g/dL)    HCT 16.1 (*) 09.6 - 46.0 (%)    MCV 96.2  78.0 - 100.0 (fL)    MCH 30.7  26.0 - 34.0 (pg)    MCHC 31.9  30.0 - 36.0 (g/dL)    RDW 04.5  40.9 - 81.1 (%)    Platelets 140 (*) 150 - 400 (K/uL)   PROTIME-INR     Status: Abnormal   Collection Time   09/14/11  7:10 AM      Component Value Range Comment   Prothrombin Time 22.3 (*) 11.6 - 15.2 (seconds)    INR 1.92 (*) 0.00 - 1.49  PROTIME-INR     Status: Abnormal   Collection Time   09/15/11  4:25 AM      Component Value Range Comment   Prothrombin Time 27.7 (*) 11.6 - 15.2  (seconds)    INR 2.53 (*) 0.00 - 1.49       HPI : The patient is an 78 year old woman with a history significant for chronic atrial fibrillation, chronic warfarin therapy, hypertension, and dementia, who presented to the emergency department on April 18th 2013 with a chief complaint of right arm pain, pelvic pain, and difficulty walking. The patient has had a recent history of frequent falls. In the emergency department, she was afebrile, hypertensive, and hemodynamically stable. However, at times, her heart rate reached 150 beats per minute. EKG revealed atrial fibrillation, nonspecific ST and T wave abnormalities, and a heart rate of 78 beats per minute.  Her lab data were significant for glucose of 161 and creatinine of 1.26. Her INR was therapeutic at 2.09. X-rays of her chest, right arm, and pelvis, revealed an acute comminuted fracture of the proximal right humerus. She was admitted for further evaluation and management.   HOSPITAL COURSE: Because of her rapid atrial fibrillation, she was admitted to the step down unit on a Cardizem drip. However shortly after admission, the Cardizem drip was discontinued in favor of oral Cardizem. Prior to this hospitalization, the patient had not been been treated with a rate limiting medication recently although in the past, she had been treated with metoprolol. It was thought that her frequent falls may have been a consequence of rapid atrial fibrillation undetected at home. She complained of right greater than left pelvic pain although the x-rays of her pelvis revealed no acute fractures. Therefore, a CT scan of her pelvis was ordered. It revealed multiple bilateral pelvic fractures and a possible right psoas hematoma. Her pain was treated with as needed Tylenol and as needed oxycodone. Lasix was withheld and she was started on gentle IV fluid hydration.  Coumadin was continued with caution. Her cardiologist, Dr. Dietrich Pates was consulted regarding ongoing rate  limiting medication treatment and to assess whether the patient was still an appropriate anticoagulation candidate given her recent frequent falls. Per his assessment, although there was a risk of intracranial bleed with additional falls, there was also a substantial risk of thromboembolism if anticoagulation was not continued. He recommended continuing warfarin. He agreed with Cardizem which had been started.  Orthopedic surgeon, Dr. Hilda Lias was consulted for assessment and recommendations of the right humeral fracture and pelvic fractures. He recommended shoulder immobilization and physical therapy as tolerated. Surgery was not indicated. Physical therapy was consulted. The patient progressed slowly with the therapist. All were in agreement that she would do well in a skilled environment with ongoing physical therapy there. Her family was in agreement.   The patient remained hemodynamically stable and afebrile. For the most part, her INR remained therapeutic. Her thyroid function was assessed and her TSH and free T4 were both within normal limits. Her platelet count fell to 139-140 after it had been within normal limits on admission. The fall in her platelet count may have been secondary to the hematoma. Her hemoglobin fell from 12.4-11.2 at the time of this dictation which is likely a mild decrease due to be possible hematoma and hemodilution. Her venous glucose was elevated on admission but normalized.  The plan is for the patient to be discharged to a skilled nursing facility of choice once a bed is available, hopefully tomorrow. She  is clinically stable for discharge. Lasix will be restarted in the morning and her IV fluids will be saline locked. A followup CBC and basic metabolic panel are ordered for tomorrow morning to assess her kidney function, hemoglobin, and platelet count. The patient's family has been kept abreast of the hospital course.  Discharge Exam: Blood pressure 132/75, pulse 73,  temperature 98.1 F (36.7 C), temperature source Oral, resp. rate 20, height 4\' 8"  (1.422 m), weight 56.5 kg (124 lb 9 oz), SpO2 93.00%.   Lungs: Clear to auscultation anteriorly with decreased breath sounds in the bases. Heart: Irregular, irregular. Abdomen: Positive bowel sounds, soft, mildly tender over the right greater than left flank area, no rigidity, no rebound, no distention. Extremities: No pedal edema. Neurologic: She is alert and oriented to herself in the hospital and family. Cranial nerves grossly intact.    Discharge Orders    Future Appointments: Provider: Department: Dept Phone: Center:   10/01/2011 1:40 PM [facility] Maryruth Bun [phone] [facility]     Future Orders Please Complete By Expires   Diet - low sodium heart healthy      Increase activity slowly         Total discharge time: 35 minutes    Signed: Trentin Knappenberger 09/15/2011, 3:37 PM

## 2011-09-15 NOTE — Assessment & Plan Note (Signed)
Minimal thrombocytopenia.  Serial testing is appropriate.

## 2011-09-16 LAB — CBC
Hemoglobin: 9.9 g/dL — ABNORMAL LOW (ref 12.0–15.0)
MCHC: 32.6 g/dL (ref 30.0–36.0)
WBC: 5.6 10*3/uL (ref 4.0–10.5)

## 2011-09-16 LAB — PROTIME-INR
INR: 2.89 — ABNORMAL HIGH (ref 0.00–1.49)
Prothrombin Time: 30.7 seconds — ABNORMAL HIGH (ref 11.6–15.2)

## 2011-09-16 LAB — BASIC METABOLIC PANEL
BUN: 26 mg/dL — ABNORMAL HIGH (ref 6–23)
GFR calc Af Amer: 48 mL/min — ABNORMAL LOW (ref >90)
GFR calc non Af Amer: 41 mL/min — ABNORMAL LOW (ref >90)
Potassium: 4.6 mEq/L (ref 3.5–5.1)
Sodium: 134 mEq/L — ABNORMAL LOW (ref 135–145)

## 2011-09-16 MED ORDER — WARFARIN SODIUM 1 MG PO TABS
1.0000 mg | ORAL_TABLET | Freq: Once | ORAL | Status: AC
  Administered 2011-09-16: 1 mg via ORAL
  Filled 2011-09-16: qty 1

## 2011-09-16 MED ORDER — FLEET ENEMA 7-19 GM/118ML RE ENEM: 1.0000 | ENEMA | Freq: Every day | RECTAL | Status: AC | PRN

## 2011-09-16 NOTE — Progress Notes (Signed)
Bladder Scan showed 207 ml after patient voided. Family members were concerned because "patient has not voided all day".  Patient was given a fleets enema with good results.  This information was given to the physician on call.

## 2011-09-16 NOTE — Progress Notes (Signed)
Physical Therapy Treatment Patient Details Name: [name] MRN: [id number] DOB: [birth year] Today's Date: 09/16/2011 Time: 4098-1191 PT Time Calculation (min): 41 min  PT Assessment / Plan / Recommendation Comments on Treatment Session  Patient lethargic today needing constant verbal and tactile cues to stay on task with bed exercises. Pt was total assist to get to EOB where sitting balance was poor due to  posterior leaning;close guarding and assistance needed to keep pt upright. Patient was able to stand partially with Max A and RW x2. Standing pivot transfer bed<>chair was perfromed with out AD and MaX A with Paitent LUE on PTA shoulder.    Follow Up Recommendations       Equipment Recommendations       Frequency     Plan      Precautions / Restrictions Restrictions Weight Bearing Restrictions: No  RUE no WB  Pertinent Vitals/Pain     Mobility  Bed Mobility Supine to Sit: 1: +1 Total assist Sitting - Scoot to Edge of Bed: 1: +1 Total assist Transfers Sit to Stand: 2: Max assist Stand to Sit: 3: Mod assist Stand Pivot Transfers: 2: Max assist Ambulation/Gait Ambulation/Gait Assistance: Not tested (comment) Assistive device: None Gait Pattern: Antalgic;Shuffle Stairs: No Wheelchair Mobility Wheelchair Mobility: No    Exercises General Exercises - Upper Extremity Shoulder Flexion: Left;AAROM;10 reps Shoulder ABduction: AAROM;Left;10 reps Shoulder ADduction: AAROM;Left;10 reps Shoulder Horizontal ABduction: AAROM;Left;10 reps Shoulder Horizontal ADduction: AAROM;Left;10 reps Elbow Flexion: Right;10 reps Elbow Extension: Right;10 reps General Exercises - Lower Extremity Ankle Circles/Pumps: Both;10 reps Short Arc Quad: AAROM;Both;10 reps Long Arc Quad: Both;10 reps Heel Slides: AAROM;Both;10 reps Hip ABduction/ADduction: AAROM;Both;10 reps Other Exercises Other Exercises: sitting EOB for static and dynamic balance activities; gradual posterior  leaning causing pt LOB several times   PT Goals Acute Rehab PT Goals PT Goal: Supine/Side to Sit - Progress: Progressing toward goal PT Goal: Sit to Supine/Side - Progress: Progressing toward goal PT Goal: Sit to Stand - Progress: Progressing toward goal PT Transfer Goal: Bed to Chair/Chair to Bed - Progress: Progressing toward goal  Visit Information  Last PT Received On: 09/16/11    Subjective Data      Cognition       Balance     End of Session PT - End of Session Equipment Utilized During Treatment: Gait belt Activity Tolerance: Patient limited by fatigue Patient left: in chair;with call bell/phone within reach;with chair alarm set;with family/visitor present Nurse Communication: Mobility status    Concha Sudol ATKINSO 09/16/2011, 10:11 AM

## 2011-09-16 NOTE — Progress Notes (Signed)
Clinical Social Work:  Following for placement for patient in SNF level of care.  Patient has a bed at [facility], but not available until 4/23 at 10am.  Notified MD who is agreeable, but tomorrow is last day.  Spoke with family and relayed information and also agreeable.  Patient will dc on 4/23 to SNF by EMS.  Will notify oncoming CSW for Tuesday.  Joselyne Spake Nail, MSW LCSW

## 2011-09-16 NOTE — Consult Note (Signed)
ANTICOAGULATION CONSULT NOTE   Pharmacy Consult for Warfarin Indication: atrial fibrillation  No Known Allergies  Patient Measurements: Height: 4\' 8"  (142.2 cm) Weight: 123 lb 3.8 oz (55.9 kg) IBW/kg (Calculated) : 36.3   Vital Signs: Temp: 97.6 F (36.4 C) (04/22 1340) Temp src: Oral (04/22 0530) BP: 125/72 mmHg (04/22 1340) Pulse Rate: 65  (04/22 1340)  Labs:  Basename 09/16/11 0448 09/15/11 0425 09/14/11 0710  HGB 9.9* -- 11.2*  HCT 30.4* -- 35.1*  PLT 160 -- 140*  APTT -- -- --  LABPROT 30.7* 27.7* 22.3*  INR 2.89* 2.53* 1.92*  HEPARINUNFRC -- -- --  CREATININE 1.19* -- --  CKTOTAL -- -- --  CKMB -- -- --  TROPONINI -- -- --   Estimated Creatinine Clearance: 24.9 ml/min (by C-G formula based on Cr of 1.19).  Assessment: INR therapeutic   Goal of Therapy:  INR 2-3   Plan: Repeat Coumadin 1 mg today x 1 INR daily until stable  [name] 09/16/2011,4:00 PM

## 2011-09-16 NOTE — Progress Notes (Signed)
Chart reviewed. Patient examined. No new issues reported by patient or RN.  Family in room talking on phone. Awaiting SNF. Stable for discharge. See Dr. Theodis Aguas discharge summary

## 2011-09-16 NOTE — Progress Notes (Signed)
Physical Therapy Treatment Patient Details Name: [name] MRN: [id number] DOB: Dec 05, 1925 Today's Date: 09/16/2011 Time: 8119-1478 PT Time Calculation (min): 15 min  PT Assessment / Plan / Recommendation Comments on Treatment Session  Patient needing Max A for sit<>stand transfer and for standng pericare but able to take 2-3 very small shuffled steps/ Total assist +2 for [facility]    Follow Up Recommendations       Equipment Recommendations       Frequency     Plan      Precautions / Restrictions     Pertinent Vitals/Pain     Mobility  Bed Mobility Supine to Sit: 1: +1 Total assist Sitting - Scoot to Edge of Bed: 1: +1 Total assist Sit to Supine: 1: +2 Total assist Transfers Sit to Stand: 2: Max assist Stand to Sit: 2: Max assist Stand Pivot Transfers: 2: Max assist Ambulation/Gait Ambulation/Gait Assistance: Not tested (comment) Assistive device: None Gait Pattern: Antalgic;Shuffle Stairs: No Wheelchair Mobility Wheelchair Mobility: No    Exercises General Exercises - Upper Extremity Shoulder Flexion: Left;AAROM;10 reps Shoulder ABduction: AAROM;Left;10 reps Shoulder ADduction: AAROM;Left;10 reps Shoulder Horizontal ABduction: AAROM;Left;10 reps Shoulder Horizontal ADduction: AAROM;Left;10 reps Elbow Flexion: Right;10 reps Elbow Extension: Right;10 reps General Exercises - Lower Extremity Ankle Circles/Pumps: Both;10 reps Short Arc Quad: AAROM;Both;10 reps Long Arc Quad: Both;10 reps Heel Slides: AAROM;Both;10 reps Hip ABduction/ADduction: AAROM;Both;10 reps Other Exercises Other Exercises: sitting EOB for static and dynamic balance activities; gradual posterior leaning causing pt LOB several times   PT Goals Acute Rehab PT Goals PT Goal: Supine/Side to Sit - Progress: Progressing toward goal PT Goal: Sit to Supine/Side - Progress: Progressing toward goal PT Goal: Sit to Stand - Progress: Progressing toward goal PT Transfer Goal: Bed to  Chair/Chair to Bed - Progress: Progressing toward goal  Visit Information  Last PT Received On: 09/16/11    Subjective Data      Cognition       Balance     End of Session PT - End of Session Equipment Utilized During Treatment: Gait belt Activity Tolerance: Patient limited by fatigue Patient left: in bed;with call bell/phone within reach (bed does not have alarm) Nurse Communication: Mobility status    Kenechukwu Eckstein ATKINSO 09/16/2011, 1:07 PM

## 2011-09-17 MED ORDER — WARFARIN SODIUM 2.5 MG PO TABS: 2.5000 mg | ORAL_TABLET | Freq: Once | ORAL | Status: DC

## 2011-09-17 NOTE — Progress Notes (Signed)
Report called to Flow Craigheld LPN at [facility]. Jon Billings

## 2011-09-17 NOTE — Progress Notes (Signed)
Clinical Social Work Department CLINICAL SOCIAL WORK PLACEMENT NOTE 09/17/2011  Patient:  [name], [name]  Account Number:  [id number] Admit date:  09/11/2011  Clinical Social Worker: Reece Levy, LCSW  Date/time:  09/13/2011 01:12 PM  Clinical Social Work is seeking post-discharge placement for this patient at the following level of care:   SKILLED NURSING   (*CSW will update this form in Epic as items are completed)   09/13/2011  Patient/family provided with [facility] Department of Clinical Social Work's list of facilities offering this level of care within the geographic area requested by the patient (or if unable, by the patient's family).  09/13/2011  Patient/family informed of their freedom to choose among providers that offer the needed level of care, that participate in Medicare, Medicaid or managed care program needed by the patient, have an available bed and are willing to accept the patient.  09/13/2011  Patient/family informed of [facility]' ownership interest in [facility], as well as of the fact that they are under no obligation to receive care at this facility.  PASARR submitted to [employer] on 09/12/2011 PASARR number received from [employer] on 09/13/2011  FL2 transmitted to all facilities in geographic area requested by pt/family on  09/13/2011 FL2 transmitted to all facilities within larger geographic area on   Patient informed that his/her managed care company has contracts with or will negotiate with  certain facilities, including the following:     Patient/family informed of bed offers received:  09/16/2011 Patient chooses bed at [facility] Physician recommends and patient chooses bed at    Patient to be transferred to [facility]F on  09/17/2011 Patient to be transferred to facility by EMS  The following physician request were entered in Epic:   Additional Comments:  Reece Levy, MSW, Theresia Majors [phone]

## 2011-09-17 NOTE — Consult Note (Signed)
ANTICOAGULATION CONSULT NOTE   Pharmacy Consult for Warfarin Indication: atrial fibrillation  No Known Allergies  Patient Measurements: Height: 4\' 8"  (142.2 cm) Weight: 129 lb 3 oz (58.6 kg) IBW/kg (Calculated) : 36.3   Vital Signs: Temp: 98.5 F (36.9 C) (04/23 0607) Temp src: Oral (04/23 0607) BP: 163/96 mmHg (04/23 0607) Pulse Rate: 70  (04/23 0824)  Labs:  Alvira Philips 09/17/11 0459 09/16/11 0448 09/15/11 0425  HGB -- 9.9* --  HCT -- 30.4* --  PLT -- 160 --  APTT -- -- --  LABPROT 26.8* 30.7* 27.7*  INR 2.43* 2.89* 2.53*  HEPARINUNFRC -- -- --  CREATININE -- 1.19* --  CKTOTAL -- -- --  CKMB -- -- --  TROPONINI -- -- --   Estimated Creatinine Clearance: 25.6 ml/min (by C-G formula based on Cr of 1.19).  Assessment: INR therapeutic  Goal of Therapy:  INR 2-3   Plan: Coumadin 2.5mg  today x 1 INR daily until stable  Angell Honse A 09/17/2011,9:31 AM

## 2011-09-17 NOTE — Progress Notes (Signed)
   CARE MANAGEMENT NOTE 09/17/2011  Patient:  [name], [name]   Account Number:  [id number]  Date Initiated:  09/13/2011  Documentation initiated by:  Rosemary Holms  Subjective/Objective Assessment:   Pt admitted from home with care given by son, Cathlean Sauer and daughter, [name] over the weekends. Baseline is up with walker. Gets her coumadin checked at the [place] clinic     Action/Plan:   Yesterday family wished to take pt home at discharge and request [facility] and DME from [facility]. Per CSW, family is now wanting placement in SNF.   Anticipated DC Date:  09/16/2011   Anticipated DC Plan:  SKILLED NURSING FACILITY      DC Planning Services  CM consult      Choice offered to / List presented to:             Status of service:  Completed, signed off Medicare Important Message given?   (If response is "NO", the following Medicare IM given date fields will be blank) Date Medicare IM given:   Date Additional Medicare IM given:    Discharge Disposition:  SKILLED NURSING FACILITY  Per UR Regulation:    If discussed at Long Length of Stay Meetings, dates discussed:    Comments:  09/17/11 1400 Draedyn Weidinger Leanord Hawking RN BSN CM  09/13/11 1615 Nhat Hearne Leanord Hawking RN BSN CM

## 2011-09-20 ENCOUNTER — Encounter: Payer: Self-pay | Admitting: *Deleted

## 2011-09-24 ENCOUNTER — Encounter: Payer: Self-pay | Admitting: *Deleted

## 2011-11-26 ENCOUNTER — Ambulatory Visit: Payer: Self-pay | Admitting: *Deleted

## 2011-11-26 DIAGNOSIS — F0390 Unspecified dementia without behavioral disturbance: Secondary | ICD-10-CM

## 2012-05-19 IMAGING — CT CT PELVIS W/O CM
2 of 3 series · 15 of 46 positions shown, 17 images · non-contrast
Comparison: 09/11/2011

CLINICAL DATA: Bilateral hip pain.  Fall on 09/11/2011.

CT PELVIS WITHOUT CONTRAST
TECHNIQUE: Multidetector CT imaging of the pelvis was performed
following the standard protocol without intravenous contrast.

[Series 4: mpr pelvis cor bone 2.0 cor · coronal · 0.46mm/px · 3 of 108 slices shown]
[im 36/108  soft-tissue]
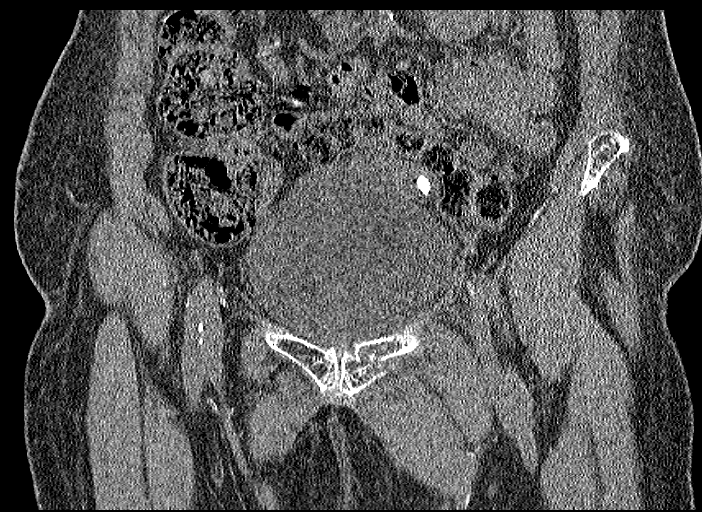
[im 48/108  soft-tissue]
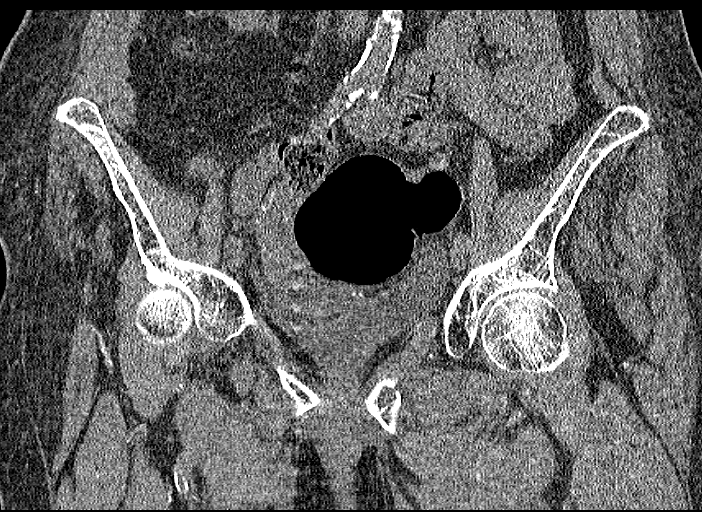
[im 60/108  soft-tissue]
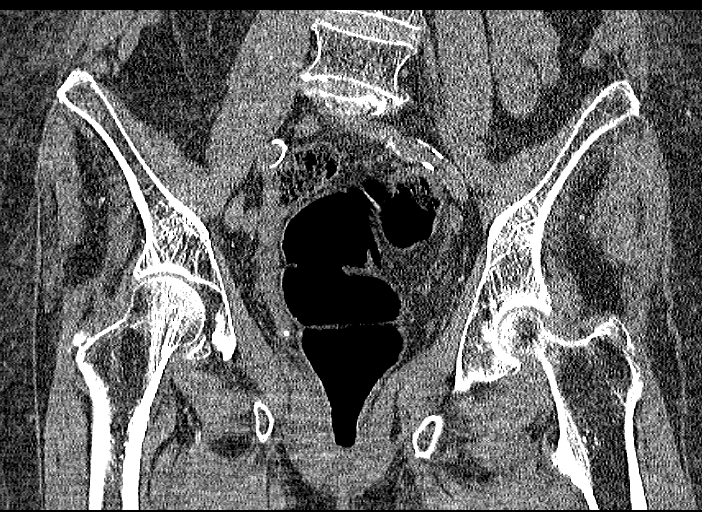

[Series 6: pelvis axial st 2.0 · axial · 0.63mm/px · z∈[-250,-48]mm · 12 of 117 slices shown, 14 images]
[im 8/117  soft-tissue]
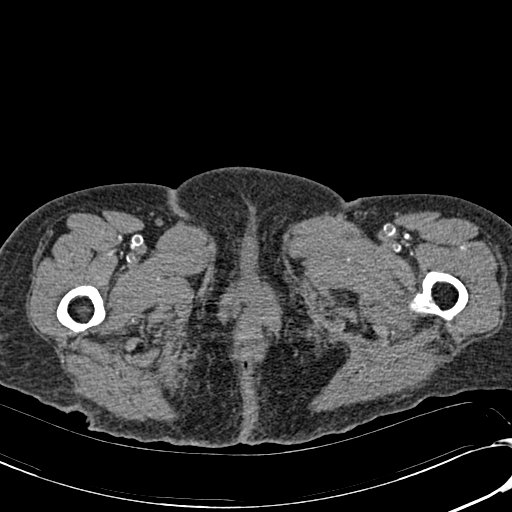
[im 8/117  bone]
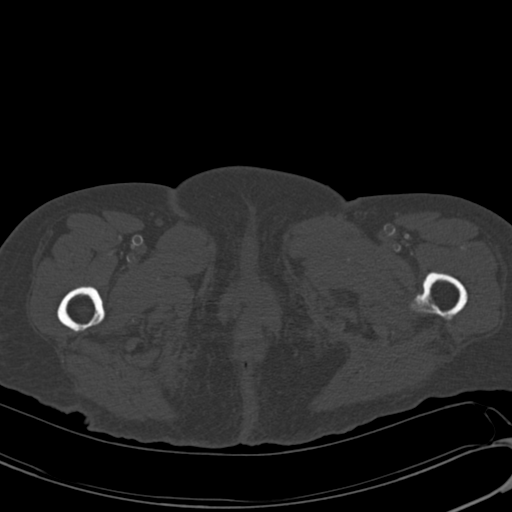
[im 15/117  soft-tissue]
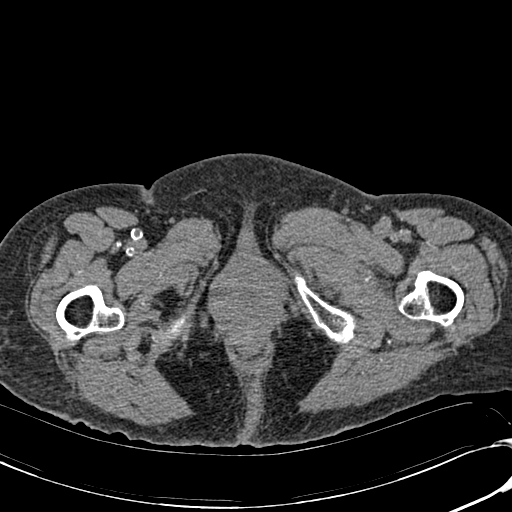
[im 27/117  soft-tissue]
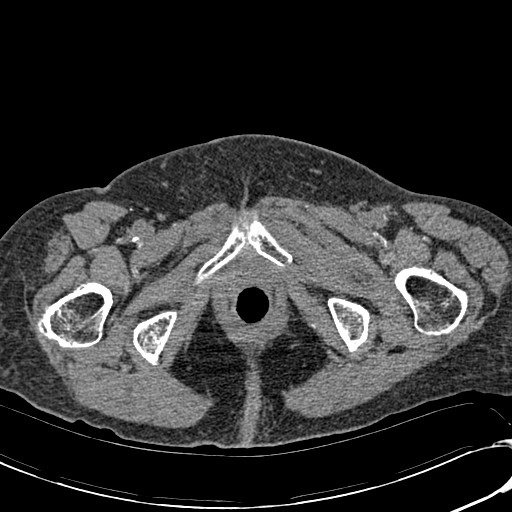
[im 34/117  soft-tissue]
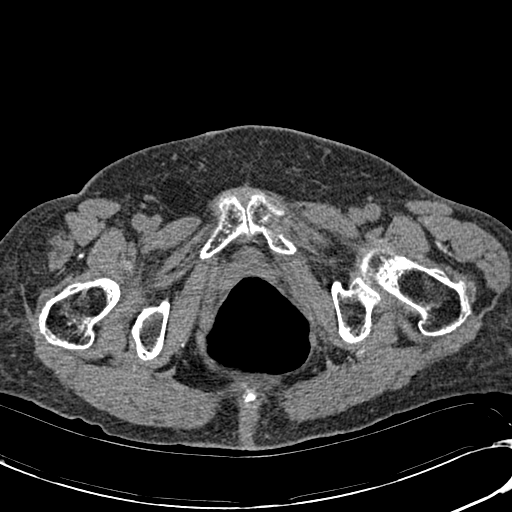
[im 45/117  soft-tissue]
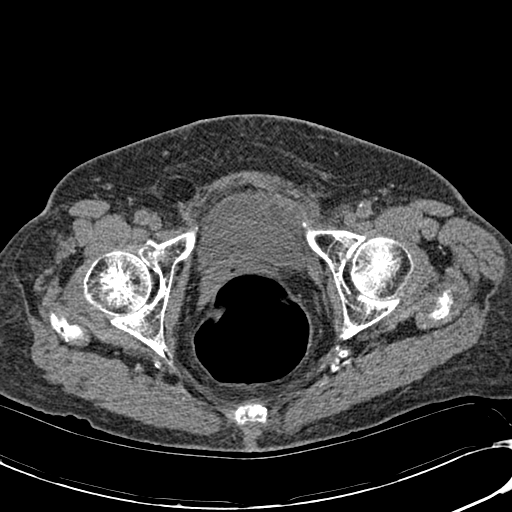
[im 53/117  soft-tissue]
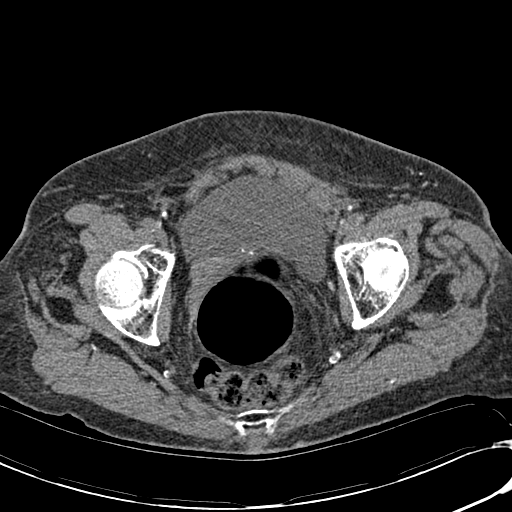
[im 64/117  soft-tissue]
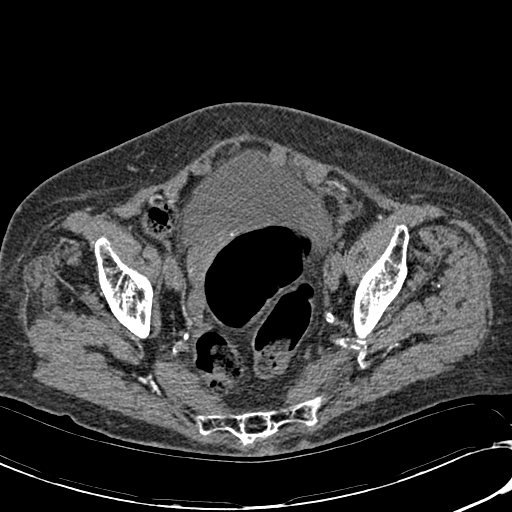
[im 72/117  soft-tissue]
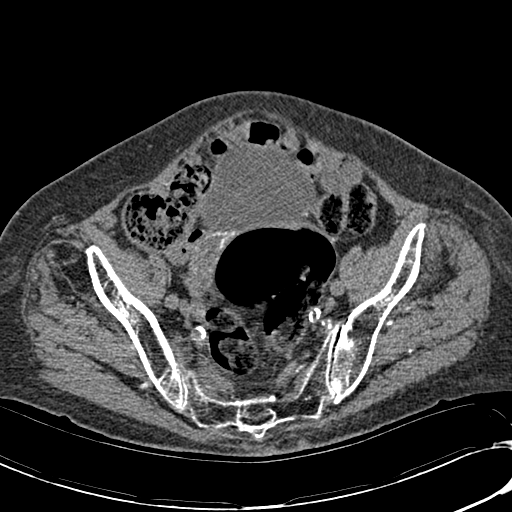
[im 83/117  soft-tissue]
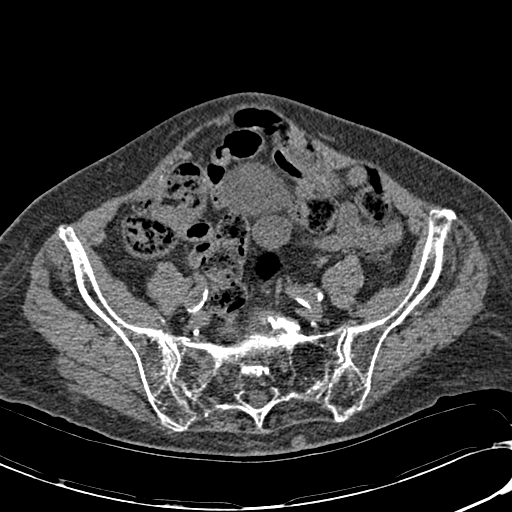
[im 83/117  bone]
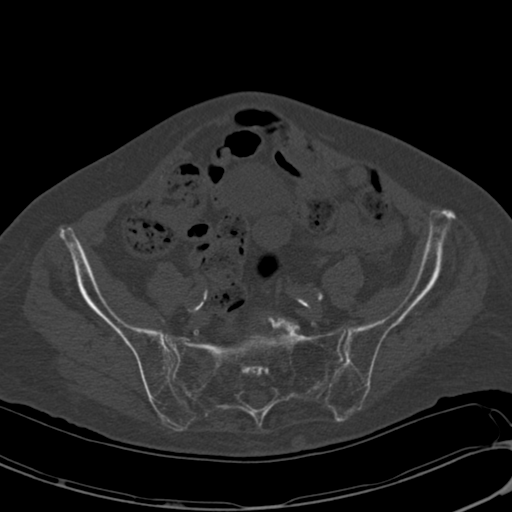
[im 90/117  soft-tissue]
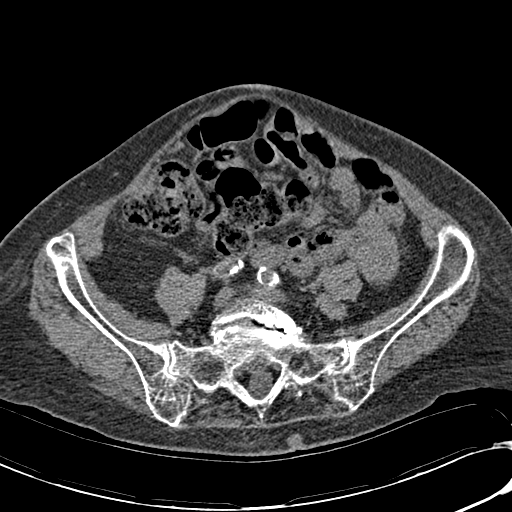
[im 102/117  soft-tissue]
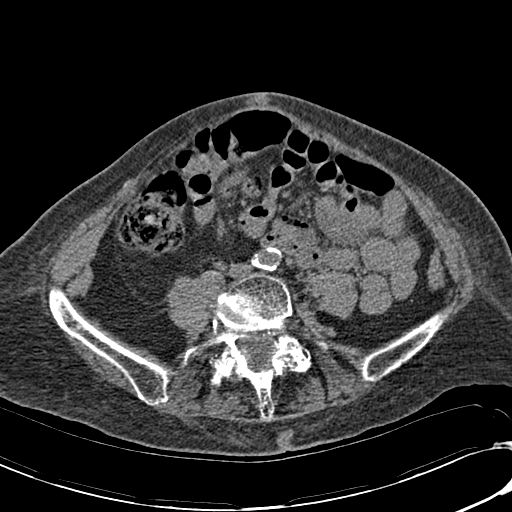
[im 109/117  soft-tissue]
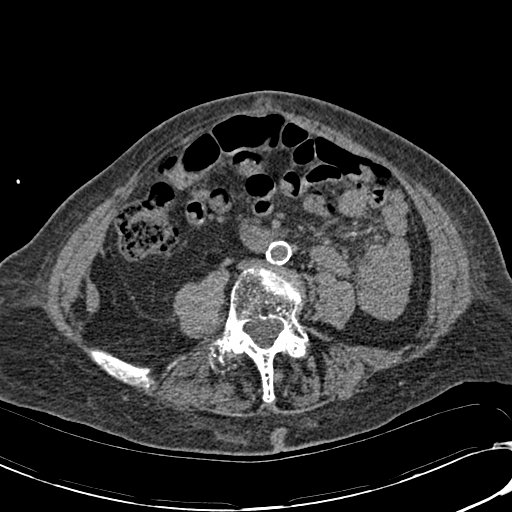

[15 of 46 positions shown; findings below may reference images not displayed]

FINDINGS: Marked bony demineralization noted.

Abnormal anterior spur like configuration along the right sacrum is
compatible with right sacral alar fracture.

There is an acute fracture of the left pubic body involving the
margins of the superior and inferior pubic rami.  There is also a
nondisplaced fracture of both inferior pubic rami, and a fracture
of the lateral margin of the right superior pubic ramus extending
into the anterior acetabular wall.

No cortical discontinuity in the proximal femurs to suggest
proximal femoral fracture.

Aortoiliac atherosclerotic calcification noted.  The right inguinal
hernia contains adipose tissue.

Right gluteus medius atrophy noted mild asymmetry of the right
upper psoas muscle compared to the left favors a right psoas
hematoma.  There is atrophy of the right inferior rectus abdominus
muscle.

Anterior subluxation of L4 on L5 by 4 mm noted, with probable
fusion of the L4 and L5 facets.  There is 5 mm of anterior
subluxation of L5 on S1 associated with a 50% compression fracture
of L5 which is age indeterminate.
IMPRESSION: 1.  Acute fractures of the left pubic body with extension into the
adjacent pubic rami, the left inferior pubic ramus, the right
inferior pubic ramus, lateral portion of the right superior pubic
ramus extending into the anterior wall of the acetabulum, and of
the right sacrum.
2.  Indeterminate 50% compression fracture of L5 - probably old.
Lower lumbar spondylosis and lower lumbar chronic degenerative
subluxations noted.
3.  Abnormal prominence the right psoas muscle compared the left,
suspicious for psoas hematoma.  The superior extent of this process
is not included on today's pelvis CT.
4.  Aortoiliac atherosclerotic calcification.
5.  Small right inguinal hernia contains adipose tissue.
6.  Prominent bony demineralization.

The results of this exam were flagged for direct telephone
communication of report at the time of report approval.
Documentation of communication is available under the workflow
layer of [HOSPITAL] PACS.

## 2012-05-19 IMAGING — CT CT HEAD W/O CM
1 series · 16 of 30 positions shown, 20 images · non-contrast
Comparison: Head CT 11/17/2005

CLINICAL DATA: Fall.  History of mini strokes.

CT HEAD WITHOUT CONTRAST
TECHNIQUE: Contiguous axial images were obtained from the base of
the skull through the vertex without contrast.

[Series 2: headseq 4.8 h37s · axial · 0.40mm/px · z∈[+81,+236]mm · 16 of 36 slices shown, 20 images]
[im 2/36  brain]
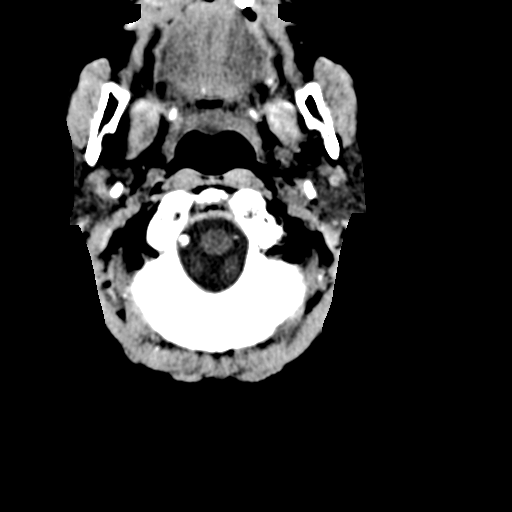
[im 2/36  bone]
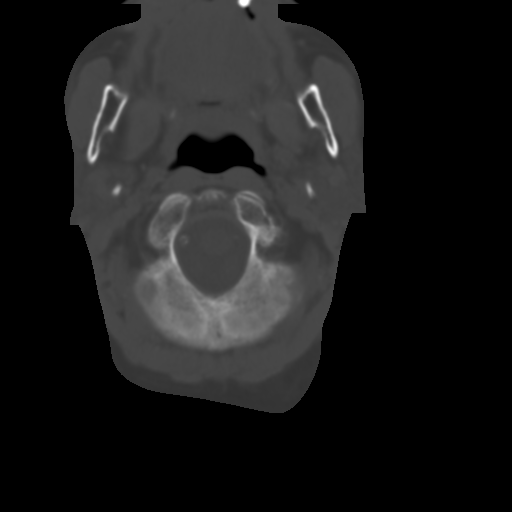
[im 4/36  brain]
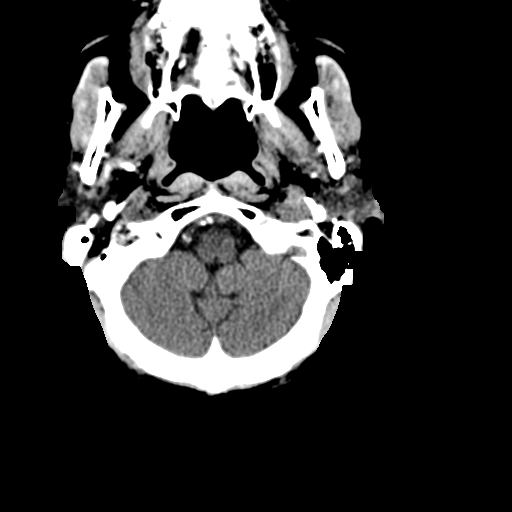
[im 7/36  brain]
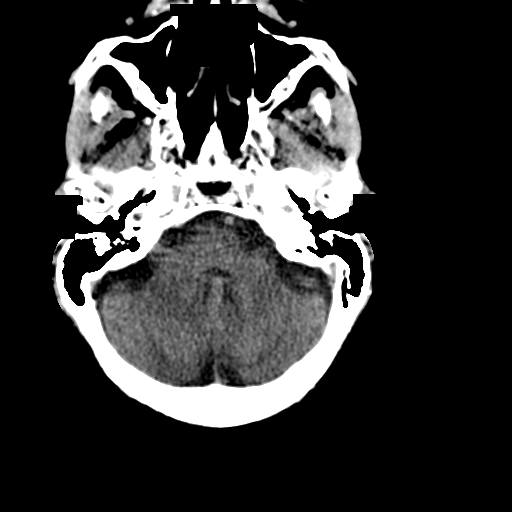
[im 9/36  brain]
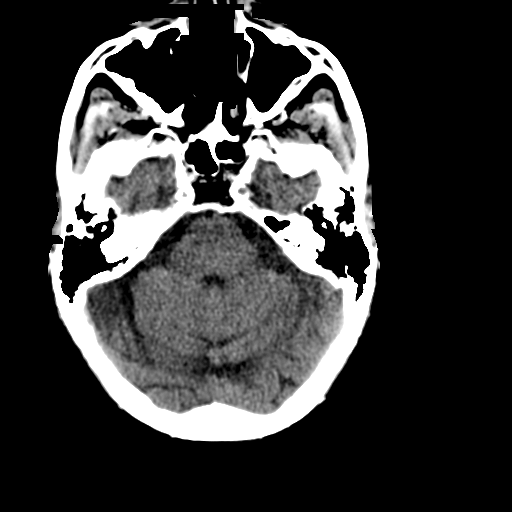
[im 10/36  brain]
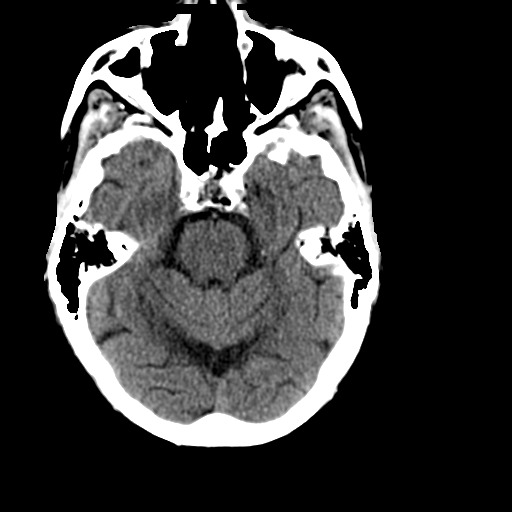
[im 10/36  bone]
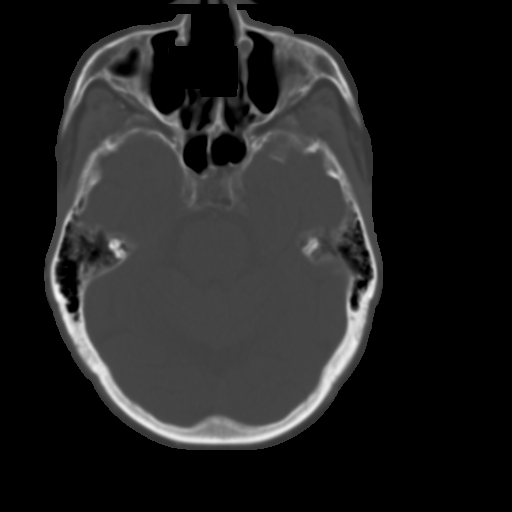
[im 13/36  brain]
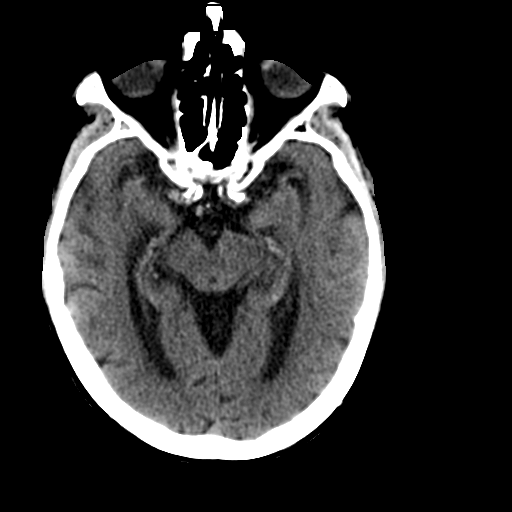
[im 15/36  brain]
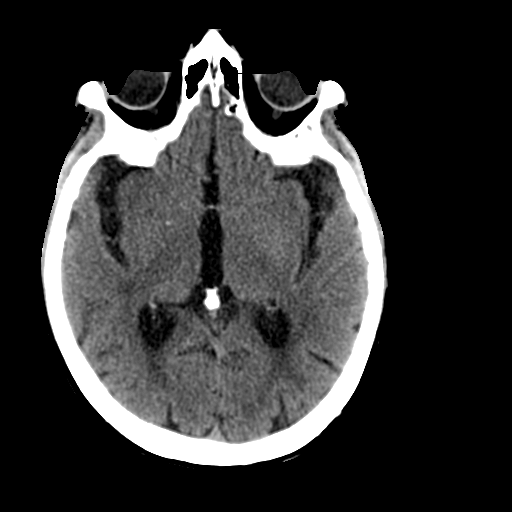
[im 17/36  brain]
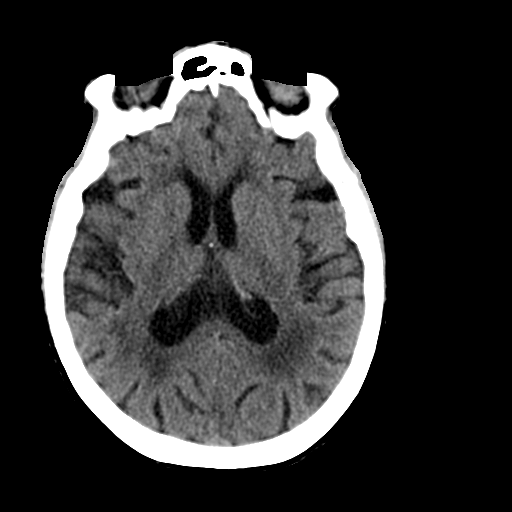
[im 19/36  brain]
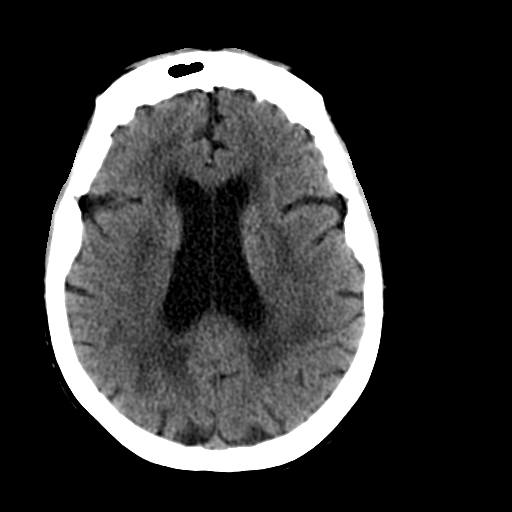
[im 19/36  bone]
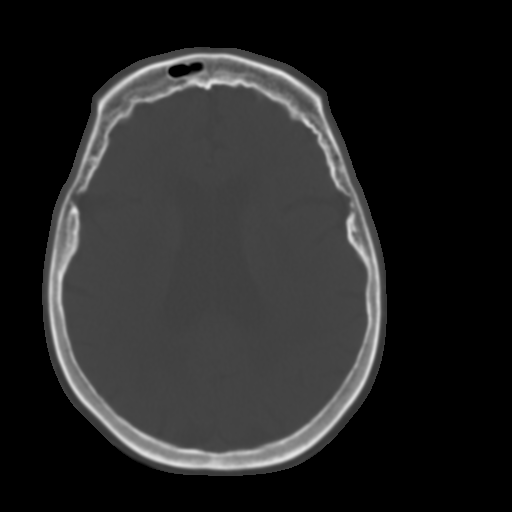
[im 21/36  brain]
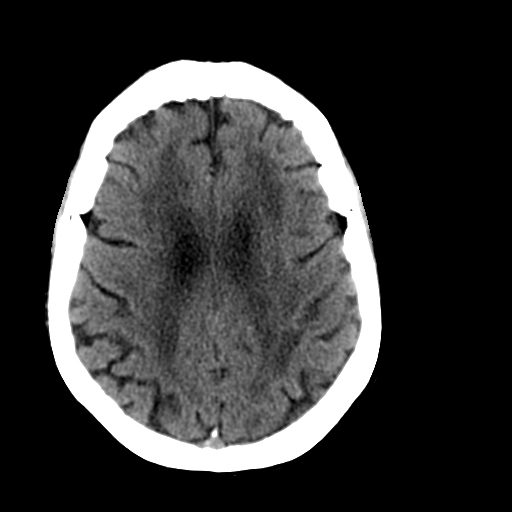
[im 23/36  brain]
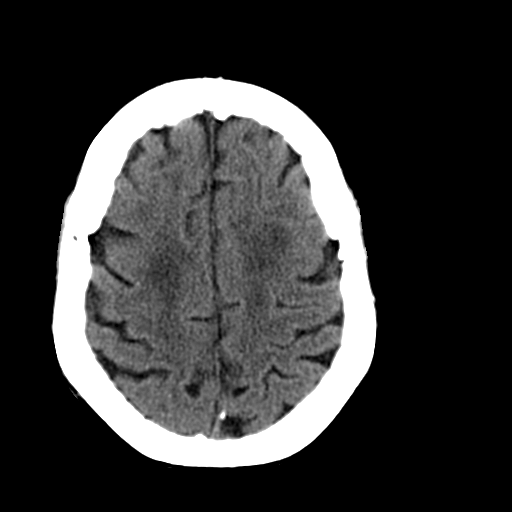
[im 26/36  brain]
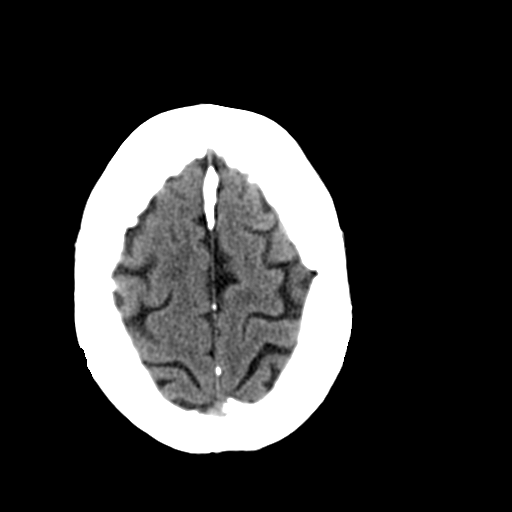
[im 27/36  brain]
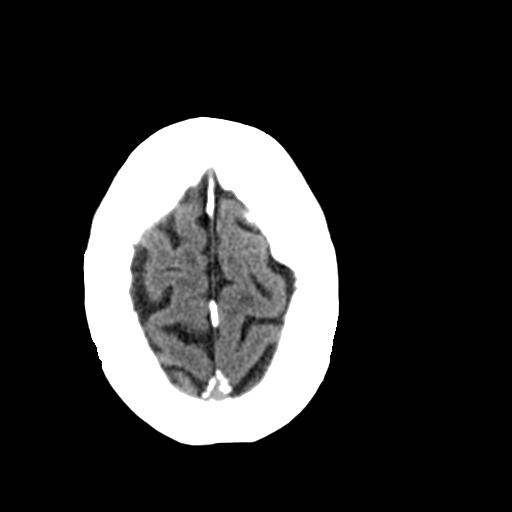
[im 27/36  bone]
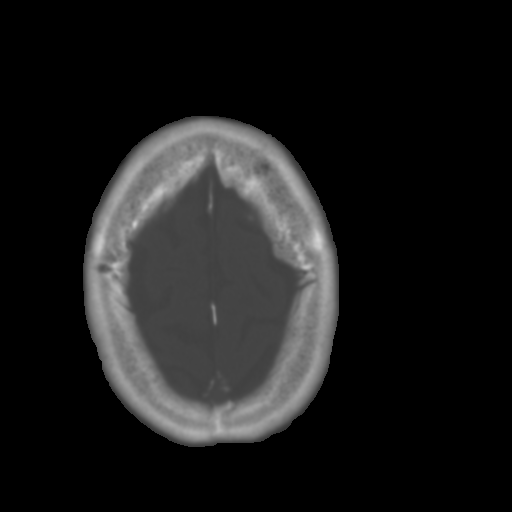
[im 29/36  brain]
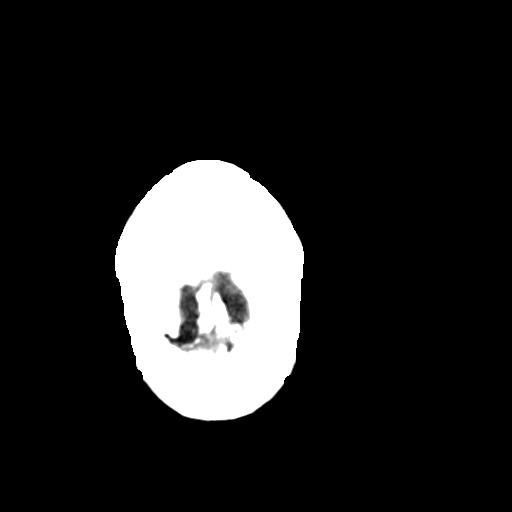
[im 32/36  brain]
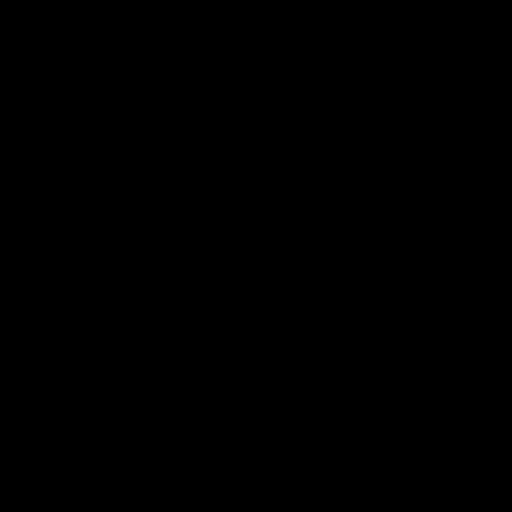
[im 34/36  brain]
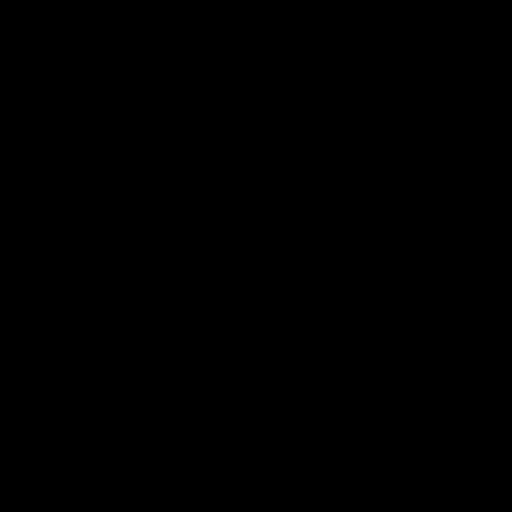

[16 of 30 positions shown; findings below may reference images not displayed]

FINDINGS: There is moderate periventricular patchy white matter
hypoattenuation suggesting chronic microvascular ischemic changes.
The ventricles are normal in size.  Slight age-related cerebral
atrophy.  Negative for hemorrhage, mass effect, mass lesion, or
evidence of acute cortically based infarction. The visualized
paranasal sinuses, mastoid air cells, and middle ears are clear..
Thickened walls of the maxillary sinuses bilaterally, chronic, and
may be due to prior infection.
IMPRESSION: 1.  No acute intracranial abnormality.
2.  Chronic extensive microvascular ischemic changes.

## 2015-08-26 DEATH — deceased

## 2015-12-21 ENCOUNTER — Telehealth: Payer: Self-pay | Admitting: *Deleted

## 2015-12-21 NOTE — Telephone Encounter (Signed)
Entered in error
